# Patient Record
Sex: Female | Born: 1965 | Race: Asian | Hispanic: No | Marital: Married | Smoking: Never smoker
Health system: Southern US, Community
[De-identification: ages and names within clinical notes are randomized; demographics above are authoritative.]

## PROBLEM LIST (undated history)

## (undated) DIAGNOSIS — D219 Benign neoplasm of connective and other soft tissue, unspecified: Secondary | ICD-10-CM

## (undated) HISTORY — PX: APPENDECTOMY: SHX54

---

## 1991-08-26 HISTORY — PX: APPENDECTOMY: SHX54

## 2002-12-05 ENCOUNTER — Other Ambulatory Visit: Admission: RE | Admit: 2002-12-05 | Discharge: 2002-12-05 | Payer: Self-pay | Admitting: Internal Medicine

## 2002-12-05 ENCOUNTER — Other Ambulatory Visit: Admission: RE | Admit: 2002-12-05 | Discharge: 2002-12-05 | Payer: Self-pay | Admitting: Obstetrics and Gynecology

## 2005-06-26 ENCOUNTER — Ambulatory Visit: Payer: Self-pay | Admitting: Internal Medicine

## 2005-07-07 ENCOUNTER — Other Ambulatory Visit: Admission: RE | Admit: 2005-07-07 | Discharge: 2005-07-07 | Payer: Self-pay | Admitting: Obstetrics and Gynecology

## 2005-09-15 ENCOUNTER — Ambulatory Visit: Payer: Self-pay | Admitting: Internal Medicine

## 2006-01-22 ENCOUNTER — Inpatient Hospital Stay (HOSPITAL_COMMUNITY): Admission: AD | Admit: 2006-01-22 | Discharge: 2006-01-24 | Payer: Self-pay | Admitting: Obstetrics and Gynecology

## 2006-05-11 ENCOUNTER — Ambulatory Visit: Payer: Self-pay | Admitting: Internal Medicine

## 2007-01-24 LAB — CONVERTED CEMR LAB: Pap Smear: NORMAL

## 2007-05-07 DIAGNOSIS — M545 Low back pain: Secondary | ICD-10-CM

## 2007-10-21 ENCOUNTER — Ambulatory Visit: Payer: Self-pay | Admitting: Internal Medicine

## 2007-10-21 LAB — CONVERTED CEMR LAB
AST: 14 units/L (ref 0–37)
Alkaline Phosphatase: 42 units/L (ref 39–117)
CO2: 26 meq/L (ref 19–32)
Calcium: 9.1 mg/dL (ref 8.4–10.5)
Chloride: 106 meq/L (ref 96–112)
Cholesterol: 205 mg/dL (ref 0–200)
HCT: 37.4 % (ref 36.0–46.0)
HDL: 56.5 mg/dL (ref >39.0)
Hemoglobin: 12.1 g/dL (ref 12.0–15.0)
Lymphocytes Relative: 39 % (ref 12.0–46.0)
MCHC: 32.4 g/dL (ref 30.0–36.0)
MCV: 80 fL (ref 78.0–100.0)
Monocytes Absolute: 0.4 10*3/uL (ref 0.2–0.7)
Monocytes Relative: 4.5 % (ref 3.0–11.0)
Neutrophils Relative %: 52.8 % (ref 43.0–77.0)
Platelets: 318 10*3/uL (ref 150–400)
RBC: 4.68 M/uL (ref 3.87–5.11)
RDW: 13.9 % (ref 11.5–14.6)
Sodium: 138 meq/L (ref 135–145)
Total Bilirubin: 0.7 mg/dL (ref 0.3–1.2)
Triglycerides: 88 mg/dL (ref 0–149)
VLDL: 18 mg/dL (ref 0–40)
WBC: 8.2 10*3/uL (ref 4.5–10.5)

## 2007-10-28 ENCOUNTER — Ambulatory Visit: Payer: Self-pay | Admitting: Internal Medicine

## 2007-10-28 LAB — CONVERTED CEMR LAB
Nitrite: NEGATIVE
Protein, U semiquant: NEGATIVE
Urobilinogen, UA: 0.2
pH: 5.5

## 2008-06-14 ENCOUNTER — Encounter: Payer: Self-pay | Admitting: Internal Medicine

## 2008-06-15 ENCOUNTER — Encounter: Payer: Self-pay | Admitting: Internal Medicine

## 2008-06-15 ENCOUNTER — Encounter: Admission: RE | Admit: 2008-06-15 | Discharge: 2008-06-15 | Payer: Self-pay | Admitting: Gastroenterology

## 2008-06-21 ENCOUNTER — Encounter: Payer: Self-pay | Admitting: Internal Medicine

## 2008-07-12 ENCOUNTER — Encounter: Payer: Self-pay | Admitting: Internal Medicine

## 2009-02-19 ENCOUNTER — Ambulatory Visit: Payer: Self-pay | Admitting: Internal Medicine

## 2009-02-19 DIAGNOSIS — G44209 Tension-type headache, unspecified, not intractable: Secondary | ICD-10-CM

## 2010-10-24 LAB — HM MAMMOGRAPHY: HM Mammogram: NEGATIVE

## 2011-06-02 ENCOUNTER — Encounter (HOSPITAL_COMMUNITY): Payer: Self-pay | Admitting: *Deleted

## 2011-06-02 ENCOUNTER — Inpatient Hospital Stay (HOSPITAL_COMMUNITY)
Admission: AD | Admit: 2011-06-02 | Discharge: 2011-06-02 | Disposition: A | Discharge status: Home / Self Care | Payer: BC Managed Care – PPO | Source: Ambulatory Visit | Attending: Obstetrics and Gynecology | Admitting: Obstetrics and Gynecology

## 2011-06-02 DIAGNOSIS — N920 Excessive and frequent menstruation with regular cycle: Secondary | ICD-10-CM

## 2011-06-02 LAB — CBC
HCT: 33 % — ABNORMAL LOW (ref 36.0–46.0)
MCH: 26.8 pg (ref 26.0–34.0)
MCHC: 31.2 g/dL (ref 30.0–36.0)
MCV: 85.7 fL (ref 78.0–100.0)
Platelets: 228 10*3/uL (ref 150–400)

## 2011-06-02 LAB — POCT PREGNANCY, URINE: Preg Test, Ur: NEGATIVE

## 2011-06-02 MED ORDER — FERROUS SULFATE 325 (65 FE) MG PO TABS: 325.0000 mg | ORAL_TABLET | Freq: Every day | ORAL | Status: DC

## 2011-06-02 NOTE — Progress Notes (Signed)
EXAM-  DONE BY E. RICE, PA

## 2011-06-02 NOTE — ED Provider Notes (Signed)
History   Pt presents today c/o heavy vag bleeding since yesterday. She states she has been on a birth control patch to control her menses. She has not had menses in the past 4 months. She reports some lower abd cramping. She denies fever or any other sx at this time.  Chief Complaint  Patient presents with  . Vaginal Bleeding   HPI  OB History    Grav Para Term Preterm Abortions TAB SAB Ect Mult Living   3 3 3       3       No past medical history on file.  No past surgical history on file.  No family history on file.  History  Substance Use Topics  . Smoking status: Not on file  . Smokeless tobacco: Not on file  . Alcohol Use: Not on file    Allergies: No Known Allergies  Prescriptions prior to admission  Medication Sig Dispense Refill  . ibuprofen (ADVIL,MOTRIN) 200 MG tablet Take 200 mg by mouth every 6 (six) hours as needed. Patient takes for pain       . Multiple Vitamin (MULTIVITAMIN) tablet Take 1 tablet by mouth daily.          Review of Systems  Constitutional: Negative for fever and chills.  Cardiovascular: Negative for chest pain.  Gastrointestinal: Positive for abdominal pain. Negative for nausea, vomiting, diarrhea and constipation.  Genitourinary: Negative for dysuria, urgency, frequency and hematuria.  Neurological: Negative for dizziness and headaches.  Psychiatric/Behavioral: Negative for depression and suicidal ideas.   Physical Exam   Blood pressure 109/72, pulse 87, temperature 98.2 F (36.8 C), temperature source Oral, resp. rate 16, height 5' 1.5" (1.562 m), weight 127 lb (57.607 kg), last menstrual period 06/01/2011.  Physical Exam  Constitutional: She is oriented to person, place, and time. She appears well-developed and well-nourished. No distress.  HENT:  Head: Normocephalic and atraumatic.  Eyes: EOM are normal. Pupils are equal, round, and reactive to light.  GI: Soft. She exhibits no distension. There is no tenderness. There is no  rebound and no guarding.  Genitourinary: There is bleeding around the vagina. No vaginal discharge found.       Cervix Lg/closed. Moderate amount of bleeding noted on exam.  Neurological: She is alert and oriented to person, place, and time.  Skin: Skin is warm and dry. She is not diaphoretic.  Psychiatric: She has a normal mood and affect. Her behavior is normal. Judgment and thought content normal.    MAU Course  Procedures  Results for orders placed during the hospital encounter of 06/02/11 (from the past 24 hour(s))  CBC     Status: Abnormal   Collection Time   06/02/11  9:04 PM      Component Value Range   WBC 7.5  4.0 - 10.5 (K/uL)   RBC 3.85 (*) 3.87 - 5.11 (MIL/uL)   Hemoglobin 10.3 (*) 12.0 - 15.0 (g/dL)   HCT 16.1 (*) 09.6 - 46.0 (%)   MCV 85.7  78.0 - 100.0 (fL)   MCH 26.8  26.0 - 34.0 (pg)   MCHC 31.2  30.0 - 36.0 (g/dL)   RDW 04.5  40.9 - 81.1 (%)   Platelets 228  150 - 400 (K/uL)  POCT PREGNANCY, URINE     Status: Normal   Collection Time   06/02/11  9:17 PM      Component Value Range   Preg Test, Ur NEGATIVE     Discussed pt with Dr. Claiborne Billings at  length. Pt will f/u in office.  Assessment and Plan  Menorrhagia: discussed with pt at length. She will f/u with Dr. Delray Alt office in the morning. Will give Rx for iron supplementation. Discussed diet, activity, risks, and precautions.  Clinton Gallant. Rice III, DrHSc, MPAS, PA-C  06/02/2011, 8:56 PM   Henrietta Hoover, PA 06/02/11 2135

## 2011-06-02 NOTE — Progress Notes (Signed)
Pt having heavy vag bleeding since 10/7, reports cramping and dizziness.

## 2011-06-02 NOTE — Progress Notes (Signed)
PT  USING BC PATCH- NO CYCLE IN June- THRU SEPT.  STILL USING PATCH- BUT  YESTERDAY STARTED BLEEDING- HEAVY WITH CLOTS.  IN RM 2 - MOD AMT BLOOD ON PAD WITH QUARTER SIZE CLOT .  CRAMPING STARTED YESTERDAY AM.   TOOK ADVIL AT 2PM- LITTLE RELIEF.

## 2011-06-03 NOTE — ED Provider Notes (Signed)
Discussed pt with Madaline Guthrie, was informed that there was some bleeding but not heavy and not soaking pads under patient.  Pt was asymptomatic with Hb of 10.3  Advised that pt can follow up in office and return to ER is begins to soak a pad in an hour.

## 2012-05-11 ENCOUNTER — Ambulatory Visit (INDEPENDENT_AMBULATORY_CARE_PROVIDER_SITE_OTHER): Payer: BC Managed Care – PPO | Admitting: Internal Medicine

## 2012-05-11 ENCOUNTER — Encounter: Payer: Self-pay | Admitting: Internal Medicine

## 2012-05-11 VITALS — BP 90/60 | HR 86 | Temp 98.0°F | Resp 18 | Ht 62.75 in | Wt 124.0 lb

## 2012-05-11 DIAGNOSIS — Z0000 Encounter for general adult medical examination without abnormal findings: Secondary | ICD-10-CM

## 2012-05-11 DIAGNOSIS — K8020 Calculus of gallbladder without cholecystitis without obstruction: Secondary | ICD-10-CM

## 2012-05-11 NOTE — Progress Notes (Signed)
Subjective:    Patient ID: Kathryn Lucero, female    DOB: 04/22/1966, 46 y.o.   MRN: [id number]  HPI  46 year old patient who is seen today to establish with this practice she is followed annually by gynecology and has enjoyed excellent health. She has also been evaluated by GI([facility]) and does have a history of asymptomatic cholelithiasis. No concerns or complaints today Past medical history is remarkable for a remote appendectomy She is a gravida 3 para 3 abortus 0 Social history she is married 3 children ages 2118 and 612 grade education works as a Advertising account planner Family history mother died at approximately age 53 unclear etiology father is in good health as are 4 brothers and 2 sisters. Father does have a history of hypertension and prostate cancer  History reviewed. No pertinent past medical history.  History   Social History  . Marital Status: Married    Spouse Name: N/A    Number of Children: N/A  . Years of Education: N/A   Occupational History  . Not on file.   Social History Main Topics  . Smoking status: Never Smoker   . Smokeless tobacco: Never Used  . Alcohol Use: No  . Drug Use: No  . Sexually Active: Not on file   Other Topics Concern  . Not on file   Social History Narrative  . No narrative on file    Past Surgical History  Procedure Date  . Appendectomy     Family History  Problem Relation Age of Onset  . Cancer Father     prostate  . Hypertension Father     No Known Allergies  Current Outpatient Prescriptions on File Prior to Visit  Medication Sig Dispense Refill  . ferrous sulfate (FERROUSUL) 325 (65 FE) MG tablet Take 1 tablet (325 mg total) by mouth daily with breakfast.  30 tablet  11  . ibuprofen (ADVIL,MOTRIN) 200 MG tablet Take 200 mg by mouth every 6 (six) hours as needed. Patient takes for pain       . Multiple Vitamin (MULTIVITAMIN) tablet Take 1 tablet by mouth daily.        . norelgestromin-ethinyl estradiol (ORTHO EVRA) 150-20  MCG/24HR transdermal patch Place 1 patch onto the skin once a week.          BP 90/60  Pulse 86  Temp 98 F (36.7 C) (Oral)  Resp 18  Ht 5' 2.75" (1.594 m)  Wt 124 lb (56.246 kg)  BMI 22.14 kg/m2  SpO2 98%  LMP 05/11/2012       Review of Systems  Constitutional: Negative for fever, appetite change, fatigue and unexpected weight change.  HENT: Negative for hearing loss, ear pain, nosebleeds, congestion, sore throat, mouth sores, trouble swallowing, neck stiffness, dental problem, voice change, sinus pressure and tinnitus.   Eyes: Negative for photophobia, pain, redness and visual disturbance.  Respiratory: Negative for cough, chest tightness and shortness of breath.   Cardiovascular: Negative for chest pain, palpitations and leg swelling.  Gastrointestinal: Negative for nausea, vomiting, abdominal pain, diarrhea, constipation, blood in stool, abdominal distention and rectal pain.  Genitourinary: Negative for dysuria, urgency, frequency, hematuria, flank pain, vaginal bleeding, vaginal discharge, difficulty urinating, genital sores, vaginal pain, menstrual problem and pelvic pain.  Musculoskeletal: Negative for back pain and arthralgias.  Skin: Negative for rash.  Neurological: Negative for dizziness, syncope, speech difficulty, weakness, light-headedness, numbness and headaches.  Hematological: Negative for adenopathy. Does not bruise/bleed easily.  Psychiatric/Behavioral: Negative for suicidal ideas, behavioral problems, self-injury, dysphoric  mood and agitation. The patient is not nervous/anxious.        Objective:   Physical Exam  Constitutional: She is oriented to person, place, and time. She appears well-developed and well-nourished.  HENT:  Head: Normocephalic and atraumatic.  Right Ear: External ear normal.  Left Ear: External ear normal.  Mouth/Throat: Oropharynx is clear and moist.  Eyes: Conjunctivae normal and EOM are normal.  Neck: Normal range of motion. Neck  supple. No JVD present. No thyromegaly present.  Cardiovascular: Normal rate, regular rhythm, normal heart sounds and intact distal pulses.   No murmur heard. Pulmonary/Chest: Effort normal and breath sounds normal. She has no wheezes. She has no rales.  Abdominal: Soft. Bowel sounds are normal. She exhibits no distension and no mass. There is no tenderness. There is no rebound and no guarding.  Musculoskeletal: Normal range of motion. She exhibits no edema and no tenderness.  Neurological: She is alert and oriented to person, place, and time. She has normal reflexes. No cranial nerve deficit. She exhibits normal muscle tone. Coordination normal.  Skin: Skin is warm and dry. No rash noted.  Psychiatric: She has a normal mood and affect. Her behavior is normal.          Assessment & Plan:   Preventive health examination Unremarkable exam Asymptomatic cholelithiasis

## 2012-05-11 NOTE — Patient Instructions (Signed)
It is important that you exercise regularly, at least 20 minutes 3 to 4 times per week.  If you develop chest pain or shortness of breath seek  medical attention.  Call or return to clinic prn if these symptoms worsen or fail to improve as anticipated.  

## 2012-06-04 ENCOUNTER — Other Ambulatory Visit: Payer: Self-pay | Admitting: Obstetrics and Gynecology

## 2012-06-04 DIAGNOSIS — R928 Other abnormal and inconclusive findings on diagnostic imaging of breast: Secondary | ICD-10-CM

## 2012-06-10 ENCOUNTER — Ambulatory Visit
Admission: RE | Admit: 2012-06-10 | Discharge: 2012-06-10 | Disposition: A | Discharge status: Home / Self Care | Payer: BC Managed Care – PPO | Source: Ambulatory Visit | Attending: Obstetrics and Gynecology | Admitting: Obstetrics and Gynecology

## 2012-06-10 DIAGNOSIS — R928 Other abnormal and inconclusive findings on diagnostic imaging of breast: Secondary | ICD-10-CM

## 2012-10-28 ENCOUNTER — Encounter: Payer: Self-pay | Admitting: Internal Medicine

## 2012-10-28 ENCOUNTER — Ambulatory Visit (INDEPENDENT_AMBULATORY_CARE_PROVIDER_SITE_OTHER): Payer: BC Managed Care – PPO | Admitting: Internal Medicine

## 2012-10-28 VITALS — BP 100/70 | Temp 97.6°F | Wt 129.0 lb

## 2012-10-28 DIAGNOSIS — R739 Hyperglycemia, unspecified: Secondary | ICD-10-CM

## 2012-10-28 DIAGNOSIS — G4700 Insomnia, unspecified: Secondary | ICD-10-CM

## 2012-10-28 MED ORDER — TRAZODONE HCL 50 MG PO TABS: 25.0000 mg | ORAL_TABLET | Freq: Every evening | ORAL | Status: DC | PRN

## 2012-10-28 NOTE — Patient Instructions (Signed)
It is important that you exercise regularly, at least 20 minutes 3 to 4 times per week.  If you develop chest pain or shortness of breath seek  medical attention.  Insomnia Insomnia is frequent trouble falling and/or staying asleep. Insomnia can be a long term problem or a short term problem. Both are common. Insomnia can be a short term problem when the wakefulness is related to a certain stress or worry. Long term insomnia is often related to ongoing stress during waking hours and/or poor sleeping habits. Overtime, sleep deprivation itself can make the problem worse. Every little thing feels more severe because you are overtired and your ability to cope is decreased. CAUSES   Stress, anxiety, and depression.  Poor sleeping habits.  Distractions such as TV in the bedroom.  Naps close to bedtime.  Engaging in emotionally charged conversations before bed.  Technical reading before sleep.  Alcohol and other sedatives. They may make the problem worse. They can hurt normal sleep patterns and normal dream activity.  Stimulants such as caffeine for several hours prior to bedtime.  Pain syndromes and shortness of breath can cause insomnia.  Exercise late at night.  Changing time zones may cause sleeping problems (jet lag). It is sometimes helpful to have someone observe your sleeping patterns. They should look for periods of not breathing during the night (sleep apnea). They should also look to see how long those periods last. If you live alone or observers are uncertain, you can also be observed at a sleep clinic where your sleep patterns will be professionally monitored. Sleep apnea requires a checkup and treatment. Give your caregivers your medical history. Give your caregivers observations your family has made about your sleep.  SYMPTOMS   Not feeling rested in the morning.  Anxiety and restlessness at bedtime.  Difficulty falling and staying asleep. TREATMENT   Your caregiver may  prescribe treatment for an underlying medical disorders. Your caregiver can give advice or help if you are using alcohol or other drugs for self-medication. Treatment of underlying problems will usually eliminate insomnia problems.  Medications can be prescribed for short time use. They are generally not recommended for lengthy use.  Over-the-counter sleep medicines are not recommended for lengthy use. They can be habit forming.  You can promote easier sleeping by making lifestyle changes such as:  Using relaxation techniques that help with breathing and reduce muscle tension.  Exercising earlier in the day.  Changing your diet and the time of your last meal. No night time snacks.  Establish a regular time to go to bed.  Counseling can help with stressful problems and worry.  Soothing music and white noise may be helpful if there are background noises you cannot remove.  Stop tedious detailed work at least one hour before bedtime. HOME CARE INSTRUCTIONS   Keep a diary. Inform your caregiver about your progress. This includes any medication side effects. See your caregiver regularly. Take note of:  Times when you are asleep.  Times when you are awake during the night.  The quality of your sleep.  How you feel the next day. This information will help your caregiver care for you.  Get out of bed if you are still awake after 15 minutes. Read or do some quiet activity. Keep the lights down. Wait until you feel sleepy and go back to bed.  Keep regular sleeping and waking hours. Avoid naps.  Exercise regularly.  Avoid distractions at bedtime. Distractions include watching television or engaging in   any intense or detailed activity like attempting to balance the household checkbook.  Develop a bedtime ritual. Keep a familiar routine of bathing, brushing your teeth, climbing into bed at the same time each night, listening to soothing music. Routines increase the success of falling to  sleep faster.  Use relaxation techniques. This can be using breathing and muscle tension release routines. It can also include visualizing peaceful scenes. You can also help control troubling or intruding thoughts by keeping your mind occupied with boring or repetitive thoughts like the old concept of counting sheep. You can make it more creative like imagining planting one beautiful flower after another in your backyard garden.  During your day, work to eliminate stress. When this is not possible use some of the previous suggestions to help reduce the anxiety that accompanies stressful situations. MAKE SURE YOU:   Understand these instructions.  Will watch your condition.  Will get help right away if you are not doing well or get worse. Document Released: 08/08/2000 Document Revised: 11/03/2011 Document Reviewed: 09/08/2007 [facility] Patient Information 2013 [place], [place]. How to Increase Fiber in the Meal Plan for Diabetes Increasing fiber in the diet has many benefits including lowering blood cholesterol, helping to control blood glucose (sugar), preventing constipation, and aiding in weight management by helping you feel full longer. Start adding fiber to your diet slowly. A gradual substitution of high fiber foods for low fiber foods will allow the digestive tract to adjust. Most men under 39 years of age should aim to eat 38 g of fiber a day. Women should aim for 25 g. Over 31 years of age, most men need 30 g of fiber and most women need 21 g. Below are some suggestions for increasing fiber.  Try whole-wheat bread instead of white bread. Look for words high on the list of ingredients such as whole wheat, whole rye, or whole oats.  Try baked potato with skin instead of mashed potatoes.  Try a fresh apple with skin instead of applesauce.  Try a fresh orange instead of orange juice.  Try popcorn instead of potato chips.  Try bran cereal instead of corn flakes.  Try kidney, whole  pinto, or garbanzo beans instead of bread.  Try whole-grain crackers instead of saltine crackers.  Try whole-wheat pasta instead of regular varieties.  While on a high fiber diet, drink enough water and fluids to keep your urine clear or pale yellow.  Eat a variety of high fiber foods such as fruits, vegetables, whole grains, nuts, and seeds.  Try to increase your intake of fiber by eating high fiber foods instead of taking fiber pills or supplements that contain small amounts of fiber. There can be additional benefits for long-term health and blood glucose control with high fiber foods. Aim for 5 servings of fruit and vegetables per day. SOURCES OF FIBER The following list shows the average dietary fiber for types of food in the various food groups. Starch and Bread / Dietary Fiber (g)  Whole-grain breads, 1 slice / 2 g  Whole grain,  cup / 2 g  Whole-grain cereals,  cup / 3 g  Bran cereals,  to  cup / 8 g  Starchy vegetables,  cup / 3 g  Legumes (beans, peas, lentils),  cup / 8 g  Oatmeal,  cup / 2 g  Whole-wheat pasta,  cup / 2 g  Brown rice,  cup / 2 g  Barley,  cup / 3 g Meat and Meat Substitutes /  Dietary Fiber (g) This group averages 0 grams of fiber. Exceptions are:  Nuts, seeds, 1 oz or  cup / 3 g  Chunky peanut butter, 2 tbs / 3 g Vegetables / Dietary Fiber (g)  Cooked vegetables,  to  cup / 2 to 3 g  Raw vegetables, 1 to 2 cups / 2 to 3 g Fruit / Dietary Fiber (g)  Raw or cooked fruit,  cup or 1 small, fresh piece / 2 g Milk / Dietary Fiber (g)  Milk, 1 cup or 8 oz / 0 g Fats and Oils / Dietary Fiber (g)  Fats and oils, 1 tsp / 0 g You can determine how much fiber you are eating by reading the Nutrition Facts panel on the labels of the foods you eat. FIBER IN SPECIFIC FOODS Cereals / Dietary Fiber (g)  All Bran,  cup / 9 g  All Bran with Extra Fiber,  cup / 13 g  Bran Flakes,  cup / 4 g  Cheerios,  cup / 1.5 g  Corn  Bran,  cup / 4 g  Corn Flakes,  cup / 0.75 g  Cracklin' Oat Bran,  cup / 4 g  Fiber One,  cup / 13 g  Grape Nuts, 3 tbs / 3 g  Grape Nuts Flakes,  cup / 3 g  Noodles,  cup, cooked / 0.5 g  Nutrigrain Wheat,  cup / 3.5 g  Oatmeal,  cup, cooked / 1.1 g  Pasta, white (macaroni, spaghetti),  cup, cooked / 0.5 g  Pasta, whole-wheat (macaroni, spaghetti),  cup, cooked / 2 g  [place],  cup, cooked / 3 g  Rice, wild,  cup, cooked / 0.5 g  Rice, brown,  cup, cooked / 1 g  Rice, white,  cup, cooked / 0.2 g  Shredded Wheat, bite-sized,  cup / 2 g  Total,  cup / 1.75 g  Wheat Chex,  cup / 2.5 g  Wheatena,  cup, cooked / 4 g  Wheaties,  cup / 2.75 g Bread, Starchy Vegetables, and Dried Peas and Beans / Dietary Fiber (g)  Bagel, whole / 0.6 g  Baked beans in tomato sauce,  cup, cooked / 3 g  Bran muffin, 1 small / 2.5 g  Bread, cracked wheat, 1 slice / 2.5 g  Bread, pumpernickel, 1 slice / 2.5 g  Bread, white, 1 slice / 0.4 g  Bread, whole-wheat, 1 slice / 1.4 g  Corn,  cup, canned / 2.9 g  Kidney beans,  cup, cooked / 3.5 g  Lentils, cup, cooked / 3 g  Lima beans,  cup, cooked / 4 g  Navy beans,  cup, cooked / 4 g  Peas,  cup, cooked / 4 g  Popcorn, 3 cups popped, unbuttered / 3.5 g  Potato, baked (with skin), 1 small / 4 g  Potato, baked (without skin), 1 small / 2 g  Ry-Krisp, 4 crackers / 3 g  Saltine crackers, 6 squares / 0 g  Split peas,  cup, cooked / 2.5 g  Yams (sweet potato),  cup / 1.7 g Fruit / Dietary Fiber (g)  Apple, 1 small, fresh, with skin / 4 g  Apple juice,  cup / 0.4 g  Apricots, 4 medium, fresh / 4 g  Apricots, 7 halves, dried / 2 g  Banana,  medium / 1.2 g  Blueberries,  cup / 2 g  Cantaloupe, melon / 1.3 g  Cherries,  cup, canned / 1.4 g  Grapefruit,  medium / 1.6 g  Grapes, 15 small / 1.2 g  Grape juice,  cup / 0.5 g  Orange, 1 medium, fresh / 2 g  Orange  juice,  cup / 0.5 g  Peach, 1 medium,fresh, with skin / 2 g  Pear, 1 medium, fresh, with skin / 4 g  Pineapple, cup, canned / 0.7 g  Plums, 2 whole / 2 g  Prunes, 3 whole / 1.5 g  Raspberries, 1 cup / 6 g  Strawberries, 1  cup / 4 g  Watermelon, 1  cup / 0.5 g Vegetables / Dietary Fiber (g)  Asparagus,  cup, cooked / 1 g  Beans, green and wax,  cup, cooked / 1.6 g  Beets,  cup, cooked / 1.8 g  Broccoli,  cup, cooked / 2.2 g  Brussels sprouts,  cup, cooked / 4 g  Cabbage,  cup, cooked / 2.5 g  Carrots,  cup, cooked / 2.3 g  Cauliflower,  cup, cooked / 1.1 g  Celery, 1 cup, raw / 1.5 g  Cucumber, 1 cup, raw / 0.8 g  Green pepper,  cup sliced, cooked / 1.5 g  Lettuce, 1 cup, sliced / 0.9 g  Mushrooms, 1 cup sliced, raw / 1.8 g  Onion, 1 cup sliced, raw / 1.6 g  Spinach,  cup, cooked / 2.4 g  Tomato, 1 medium, fresh / 1.5 g  Tomato juice,  cup / 0 g  Zucchini,  cup, cooked / 1.8 g Document Released: 01/31/2002 Document Revised: 11/03/2011 Document Reviewed: 02/27/2009 [facility] Patient Information 2013 [place], [place].

## 2012-10-28 NOTE — Progress Notes (Signed)
  Subjective:    Patient ID: Kathryn Lucero, female    DOB: 08-15-1966, 47 y.o.   MRN: [id number]  HPI 47 year old patient had laboratory studies performed with the OB/GYN. This revealed a fasting blood sugar is 77 and a hemoglobin A1c slightly elevated at 6.1. No family history of diabetes. Her weight and diet are excellent. Her chief complaint is insomnia 2 months duration. She often lays down with her six-year-old son fall asleep before her usual bedtime. She also some much better when she was exercising regularly. No caffeine load  History reviewed. No pertinent past medical history.  History   Social History  . Marital Status: Married    Spouse Name: N/A    Number of Children: N/A  . Years of Education: N/A   Occupational History  . Not on file.   Social History Main Topics  . Smoking status: Never Smoker   . Smokeless tobacco: Never Used  . Alcohol Use: No  . Drug Use: No  . Sexually Active: Not on file   Other Topics Concern  . Not on file   Social History Narrative  . No narrative on file    Past Surgical History  Procedure Laterality Date  . Appendectomy      Family History  Problem Relation Age of Onset  . Cancer Father     prostate  . Hypertension Father     No Known Allergies  Current Outpatient Prescriptions on File Prior to Visit  Medication Sig Dispense Refill  . ibuprofen (ADVIL,MOTRIN) 200 MG tablet Take 200 mg by mouth every 6 (six) hours as needed. Patient takes for pain       . norelgestromin-ethinyl estradiol (ORTHO EVRA) 150-20 MCG/24HR transdermal patch Place 1 patch onto the skin once a week.        . ferrous sulfate (FERROUSUL) 325 (65 FE) MG tablet Take 1 tablet (325 mg total) by mouth daily with breakfast.  30 tablet  11   No current facility-administered medications on file prior to visit.    BP 100/70  Temp(Src) 97.6 F (36.4 C) (Oral)  Wt 129 lb (58.514 kg)  BMI 23.03 kg/m2         Review of Systems  Constitutional:  Negative.   HENT: Negative for hearing loss, congestion, sore throat, rhinorrhea, dental problem, sinus pressure and tinnitus.   Eyes: Negative for pain, discharge and visual disturbance.  Respiratory: Negative for cough and shortness of breath.   Cardiovascular: Negative for chest pain, palpitations and leg swelling.  Gastrointestinal: Negative for nausea, vomiting, abdominal pain, diarrhea, constipation, blood in stool and abdominal distention.  Genitourinary: Negative for dysuria, urgency, frequency, hematuria, flank pain, vaginal bleeding, vaginal discharge, difficulty urinating, vaginal pain and pelvic pain.  Musculoskeletal: Negative for joint swelling, arthralgias and gait problem.  Skin: Negative for rash.  Neurological: Negative for dizziness, syncope, speech difficulty, weakness, numbness and headaches.  Hematological: Negative for adenopathy.  Psychiatric/Behavioral: Positive for sleep disturbance. Negative for behavioral problems, dysphoric mood and agitation. The patient is not nervous/anxious.        Objective:   Physical Exam  Constitutional: She appears well-developed and well-nourished. No distress.  Weight 129  Psychiatric: She has a normal mood and affect. Her behavior is normal. Thought content normal.          Assessment & Plan     Possible impaired glucose tolerance. We'll check a hemoglobin A1c in 6 months. We'll resume her exercise regimen Insomnia. Sleep hygiene issues discussed

## 2013-05-31 ENCOUNTER — Encounter: Payer: Self-pay | Admitting: Internal Medicine

## 2013-05-31 ENCOUNTER — Ambulatory Visit (INDEPENDENT_AMBULATORY_CARE_PROVIDER_SITE_OTHER): Payer: BC Managed Care – PPO | Admitting: Internal Medicine

## 2013-05-31 VITALS — BP 100/60 | HR 78 | Temp 97.9°F | Resp 18 | Wt 126.0 lb

## 2013-05-31 DIAGNOSIS — R7309 Other abnormal glucose: Secondary | ICD-10-CM

## 2013-05-31 DIAGNOSIS — R739 Hyperglycemia, unspecified: Secondary | ICD-10-CM

## 2013-05-31 DIAGNOSIS — Z23 Encounter for immunization: Secondary | ICD-10-CM

## 2013-05-31 NOTE — Progress Notes (Signed)
Subjective:    Patient ID: Kathryn Lucero, female    DOB: 1966/02/21, 47 y.o.   MRN: [id number]  HPI  47 year old patient who is seen today in followup. She was seen by OB/GYN earlier in the year and had a blood sugar was mildly elevated.  Her hemoglobin A1c was 6.1. She feels well today but still slightly concerned about her glycemic control. She's had a recent follow GYN exam Asymptomatic today. She does have a history of cholelithiasis and low back pain and headaches. Her insomnia has resolved.  History reviewed. No pertinent past medical history.  History   Social History  . Marital Status: Married    Spouse Name: N/A    Number of Children: N/A  . Years of Education: N/A   Occupational History  . Not on file.   Social History Main Topics  . Smoking status: Never Smoker   . Smokeless tobacco: Never Used  . Alcohol Use: No  . Drug Use: No  . Sexual Activity: Not on file   Other Topics Concern  . Not on file   Social History Narrative  . No narrative on file    Past Surgical History  Procedure Laterality Date  . Appendectomy      Family History  Problem Relation Age of Onset  . Cancer Father     prostate  . Hypertension Father     No Known Allergies  Current Outpatient Prescriptions on File Prior to Visit  Medication Sig Dispense Refill  . ibuprofen (ADVIL,MOTRIN) 200 MG tablet Take 200 mg by mouth every 6 (six) hours as needed. Patient takes for pain       . norelgestromin-ethinyl estradiol (ORTHO EVRA) 150-20 MCG/24HR transdermal patch Place 1 patch onto the skin once a week.         No current facility-administered medications on file prior to visit.    BP 100/60  Pulse 78  Temp(Src) 97.9 F (36.6 C) (Oral)  Resp 18  Wt 126 lb (57.153 kg)  BMI 22.49 kg/m2  SpO2 98%  LMP 05/29/2013       Review of Systems  Constitutional: Negative.   HENT: Negative for hearing loss, congestion, sore throat, rhinorrhea, dental problem, sinus pressure and  tinnitus.   Eyes: Negative for pain, discharge and visual disturbance.  Respiratory: Negative for cough and shortness of breath.   Cardiovascular: Negative for chest pain, palpitations and leg swelling.  Gastrointestinal: Negative for nausea, vomiting, abdominal pain, diarrhea, constipation, blood in stool and abdominal distention.  Genitourinary: Negative for dysuria, urgency, frequency, hematuria, flank pain, vaginal bleeding, vaginal discharge, difficulty urinating, vaginal pain and pelvic pain.  Musculoskeletal: Negative for joint swelling, arthralgias and gait problem.  Skin: Negative for rash.  Neurological: Negative for dizziness, syncope, speech difficulty, weakness, numbness and headaches.  Hematological: Negative for adenopathy.  Psychiatric/Behavioral: Negative for behavioral problems, dysphoric mood and agitation. The patient is not nervous/anxious.        Objective:   Physical Exam  Constitutional: She is oriented to person, place, and time. She appears well-developed and well-nourished.  HENT:  Head: Normocephalic.  Right Ear: External ear normal.  Left Ear: External ear normal.  Mouth/Throat: Oropharynx is clear and moist.  Eyes: Conjunctivae and EOM are normal. Pupils are equal, round, and reactive to light.  Neck: Normal range of motion. Neck supple. No thyromegaly present.  Cardiovascular: Normal rate, regular rhythm, normal heart sounds and intact distal pulses.   Pulmonary/Chest: Effort normal and breath sounds normal.  Abdominal: Soft. Bowel  sounds are normal. She exhibits no mass. There is no tenderness.  Musculoskeletal: Normal range of motion.  Lymphadenopathy:    She has no cervical adenopathy.  Neurological: She is alert and oriented to person, place, and time.  Skin: Skin is warm and dry. No rash noted.  Psychiatric: She has a normal mood and affect. Her behavior is normal.          Assessment & Plan:   Preventive health exam History of mild  glucose intolerance. We'll recheck a random blood sugar  Return here when necessary

## 2013-05-31 NOTE — Patient Instructions (Signed)
It is important that you exercise regularly, at least 20 minutes 3 to 4 times per week.  If you develop chest pain or shortness of breath seek  medical attention.   

## 2014-06-26 ENCOUNTER — Encounter: Payer: Self-pay | Admitting: Internal Medicine

## 2014-06-26 LAB — HM MAMMOGRAPHY: HM Mammogram: NORMAL (ref 0–4)

## 2017-01-22 ENCOUNTER — Encounter: Payer: Self-pay | Admitting: Family Medicine

## 2017-02-26 ENCOUNTER — Other Ambulatory Visit: Payer: Self-pay | Admitting: Obstetrics and Gynecology

## 2017-02-26 DIAGNOSIS — D250 Submucous leiomyoma of uterus: Secondary | ICD-10-CM

## 2017-03-12 ENCOUNTER — Ambulatory Visit
Admission: RE | Admit: 2017-03-12 | Discharge: 2017-03-12 | Disposition: A | Discharge status: Home / Self Care | Payer: BLUE CROSS/BLUE SHIELD | Source: Ambulatory Visit | Attending: Obstetrics and Gynecology | Admitting: Obstetrics and Gynecology

## 2017-03-12 ENCOUNTER — Other Ambulatory Visit (HOSPITAL_COMMUNITY): Payer: Self-pay | Admitting: Interventional Radiology

## 2017-03-12 DIAGNOSIS — D250 Submucous leiomyoma of uterus: Secondary | ICD-10-CM

## 2017-03-12 HISTORY — PX: IR RADIOLOGIST EVAL & MGMT: IMG5224

## 2017-03-12 HISTORY — DX: Benign neoplasm of connective and other soft tissue, unspecified: D21.9

## 2017-03-12 NOTE — Consult Note (Signed)
Chief Complaint:  Abnormal menstrual bleeding, uterine fibroids.  Referring Physician(s): Horvath,Michelle  History of Present Illness: Kathryn Lucero is a 51 y.o. female with abnormal menstrual bleeding related to uterine fibroids. She is a G3 P3. She has no future pregnancy plans. She is currently on a Xulane patch for birth control. Review of her menstrual cycle was performed. Last menstrual period 03/05/2017. Frequency every month lasting 7 days. She has 4 heavy days with passage of fresh blood clots. Free to see of pad changes every hour. She also reports some new interperiod bleeding. Fibroids were recently diagnosed my office ultrasound measuring up to 6 cm. She has mild bulk related symptoms with bloating, urinary frequency, abdominal pain and pressure. She has had no prior treatments for fibroids including birth-control pills or hormone replacement last Pap smear 05/25/2017 was within normal limits.  Past Medical History:  Diagnosis Date  . Fibroids     Past Surgical History:  Procedure Laterality Date  . APPENDECTOMY      Allergies: No known allergies  Medications: Prior to Admission medications   Medication Sig Start Date End Date Taking? Authorizing Provider  ibuprofen (ADVIL,MOTRIN) 200 MG tablet Take 200 mg by mouth every 6 (six) hours as needed. Patient takes for pain    Yes [provider]  norelgestromin-ethinyl estradiol (ORTHO EVRA) 150-20 MCG/24HR transdermal patch Place 1 patch onto the skin once a week.     Yes [provider]     Family History  Problem Relation Age of Onset  . Cancer Father        prostate  . Hypertension Father     Social History   Social History  . Marital status: Married    Spouse name: N/A  . Number of children: N/A  . Years of education: N/A   Social History Main Topics  . Smoking status: Never Smoker  . Smokeless tobacco: Never Used  . Alcohol use No  . Drug use: No  . Sexual activity: Not on  file   Other Topics Concern  . Not on file   Social History Narrative  . No narrative on file     Review of Systems: A 12 point ROS discussed and pertinent positives are indicated in the HPI above.  All other systems are negative.  Review of Systems  Vital Signs: BP 103/69   Pulse 67   Temp 97.9 F (36.6 C) (Oral)   Resp 14   Ht 5' 2.5" (1.588 m)   Wt 128 lb (58.1 kg)   LMP 03/05/2017   SpO2 100%   BMI 23.04 kg/m   Physical Exam  Constitutional: She is oriented to person, place, and time. She appears well-developed. No distress.  Eyes: Conjunctivae are normal.  Cardiovascular: Normal rate and regular rhythm.   No murmur heard. Pulmonary/Chest: Effort normal and breath sounds normal. No respiratory distress.  Abdominal: Soft. Bowel sounds are normal. She exhibits no distension.  Musculoskeletal: She exhibits no edema or tenderness.  Neurological: She is alert and oriented to person, place, and time.  Skin: Skin is warm and dry. She is not diaphoretic. No erythema.  Psychiatric: She has a normal mood and affect. Her behavior is normal.     Imaging: No results found.  Labs:  CBC: No results for input(s): WBC, HGB, HCT, PLT in the last 8760 hours.  COAGS: No results for input(s): INR, APTT in the last 8760 hours.  BMP: No results for input(s): NA, K,  CL, CO2, GLUCOSE, BUN, CALCIUM, CREATININE, GFRNONAA, GFRAA in the last 8760 hours.  Invalid input(s): CMP  LIVER FUNCTION TESTS: No results for input(s): BILITOT, AST, ALT, ALKPHOS, PROT, ALBUMIN in the last 8760 hours.    Assessment and Plan:  Progressive abnormal menstrual bleeding secondary to symptomatically uterine fibroids. Treatment options were reviewed. Our discussion centered around uterine fibroid embolization. The procedure, risks, benefits and alternatives were reviewed. The expected goals, recovery and outcomes were also reviewed. All questions were addressed. She was accompanied by her sister  today. After our discussion she would like to proceed with the workup. MRI of the pelvis has been scheduled 03/24/2017 at [facility] to evaluate her fibroid anatomy and make sure she is a candidate for embolization. If she is a candidate, she would need to come off the contraception for one month. She is in agreement with this.  Thank you for this interesting consult.  I greatly enjoyed meeting [name] and look forward to participating in their care.  A copy of this report was sent to the requesting provider on this date.  Electronically Signed: Greggory Keen 03/12/2017, 9:51 AM   I spent a total of  30 Minutes   in face to face in clinical consultation, greater than 50% of which was counseling/coordinating care for abnormal menstrual bleeding from uterine fibroids

## 2017-03-24 ENCOUNTER — Ambulatory Visit (HOSPITAL_COMMUNITY)
Admission: RE | Admit: 2017-03-24 | Discharge: 2017-03-24 | Disposition: A | Discharge status: Home / Self Care | Payer: BLUE CROSS/BLUE SHIELD | Source: Ambulatory Visit | Attending: Interventional Radiology | Admitting: Interventional Radiology

## 2017-03-24 DIAGNOSIS — N800 Endometriosis of uterus: Secondary | ICD-10-CM | POA: Diagnosis not present

## 2017-03-24 DIAGNOSIS — D250 Submucous leiomyoma of uterus: Secondary | ICD-10-CM | POA: Insufficient documentation

## 2017-03-24 DIAGNOSIS — D252 Subserosal leiomyoma of uterus: Secondary | ICD-10-CM | POA: Diagnosis not present

## 2017-03-24 MED ORDER — GADOBENATE DIMEGLUMINE 529 MG/ML IV SOLN
10.0000 mL | Freq: Once | INTRAVENOUS | Status: AC | PRN
  Administered 2017-03-24: 10 mL via INTRAVENOUS

## 2017-04-13 ENCOUNTER — Other Ambulatory Visit (HOSPITAL_COMMUNITY): Payer: Self-pay | Admitting: Interventional Radiology

## 2017-04-13 ENCOUNTER — Other Ambulatory Visit: Payer: Self-pay | Admitting: Obstetrics and Gynecology

## 2017-04-13 DIAGNOSIS — D250 Submucous leiomyoma of uterus: Secondary | ICD-10-CM

## 2017-05-05 ENCOUNTER — Other Ambulatory Visit: Payer: Self-pay | Admitting: Student

## 2017-05-06 ENCOUNTER — Observation Stay (HOSPITAL_COMMUNITY)
Admission: RE | Admit: 2017-05-06 | Discharge: 2017-05-07 | Disposition: A | Discharge status: Home / Self Care | Payer: BLUE CROSS/BLUE SHIELD | Source: Ambulatory Visit | Attending: Interventional Radiology | Admitting: Interventional Radiology

## 2017-05-06 ENCOUNTER — Ambulatory Visit (HOSPITAL_COMMUNITY)
Admission: RE | Admit: 2017-05-06 | Discharge: 2017-05-06 | Disposition: A | Discharge status: Home / Self Care | Payer: BLUE CROSS/BLUE SHIELD | Source: Ambulatory Visit | Attending: Interventional Radiology | Admitting: Interventional Radiology

## 2017-05-06 ENCOUNTER — Encounter (HOSPITAL_COMMUNITY): Payer: Self-pay

## 2017-05-06 DIAGNOSIS — D250 Submucous leiomyoma of uterus: Secondary | ICD-10-CM | POA: Diagnosis present

## 2017-05-06 DIAGNOSIS — N800 Endometriosis of uterus: Principal | ICD-10-CM | POA: Insufficient documentation

## 2017-05-06 DIAGNOSIS — N926 Irregular menstruation, unspecified: Secondary | ICD-10-CM | POA: Insufficient documentation

## 2017-05-06 HISTORY — PX: IR EMBO TUMOR ORGAN ISCHEMIA INFARCT INC GUIDE ROADMAPPING: IMG5449

## 2017-05-06 HISTORY — PX: IR ANGIOGRAM PELVIS SELECTIVE OR SUPRASELECTIVE: IMG661

## 2017-05-06 HISTORY — PX: IR US GUIDE VASC ACCESS LEFT: IMG2389

## 2017-05-06 HISTORY — PX: IR US GUIDE VASC ACCESS RIGHT: IMG2390

## 2017-05-06 HISTORY — PX: IR ANGIOGRAM SELECTIVE EACH ADDITIONAL VESSEL: IMG667

## 2017-05-06 LAB — CBC WITH DIFFERENTIAL/PLATELET
BASOS ABS: 0.1 10*3/uL (ref 0.0–0.1)
BASOS PCT: 1 %
EOS ABS: 0.2 10*3/uL (ref 0.0–0.7)
Eosinophils Relative: 3 %
HCT: 32.1 % — ABNORMAL LOW (ref 36.0–46.0)
HEMOGLOBIN: 9.2 g/dL — AB (ref 12.0–15.0)
LYMPHS PCT: 37 %
Lymphs Abs: 2.6 10*3/uL (ref 0.7–4.0)
MCH: 20.2 pg — ABNORMAL LOW (ref 26.0–34.0)
MCHC: 28.7 g/dL — ABNORMAL LOW (ref 30.0–36.0)
MCV: 70.5 fL — ABNORMAL LOW (ref 78.0–100.0)
MONO ABS: 0.4 10*3/uL (ref 0.1–1.0)
Monocytes Relative: 6 %
NEUTROS PCT: 53 %
Neutro Abs: 3.6 10*3/uL (ref 1.7–7.7)
PLATELETS: 312 10*3/uL (ref 150–400)
RBC: 4.55 MIL/uL (ref 3.87–5.11)
RDW: 19.6 % — ABNORMAL HIGH (ref 11.5–15.5)
WBC: 6.9 10*3/uL (ref 4.0–10.5)

## 2017-05-06 LAB — BASIC METABOLIC PANEL
Anion gap: 9 (ref 5–15)
BUN: 12 mg/dL (ref 6–20)
CO2: 23 mmol/L (ref 22–32)
Calcium: 8.6 mg/dL — ABNORMAL LOW (ref 8.9–10.3)
Chloride: 107 mmol/L (ref 101–111)
Creatinine, Ser: 0.62 mg/dL (ref 0.44–1.00)
GFR calc Af Amer: >60 mL/min (ref >60)
GFR calc non Af Amer: >60 mL/min (ref >60)
Glucose, Bld: 100 mg/dL — ABNORMAL HIGH (ref 65–99)
Potassium: 4 mmol/L (ref 3.5–5.1)
Sodium: 139 mmol/L (ref 135–145)

## 2017-05-06 LAB — APTT: aPTT: 23 seconds — ABNORMAL LOW (ref 24–36)

## 2017-05-06 LAB — HCG, SERUM, QUALITATIVE: Preg, Serum: NEGATIVE

## 2017-05-06 LAB — PROTIME-INR
INR: 0.99
PROTHROMBIN TIME: 13 s (ref 11.4–15.2)

## 2017-05-06 MED ORDER — DIPHENHYDRAMINE HCL 50 MG/ML IJ SOLN: 12.5000 mg | Freq: Four times a day (QID) | INTRAMUSCULAR | Status: DC | PRN

## 2017-05-06 MED ORDER — SODIUM CHLORIDE 0.9% FLUSH: 3.0000 mL | INTRAVENOUS | Status: DC | PRN

## 2017-05-06 MED ORDER — SODIUM CHLORIDE 0.9% FLUSH: 9.0000 mL | INTRAVENOUS | Status: DC | PRN

## 2017-05-06 MED ORDER — KETOROLAC TROMETHAMINE 30 MG/ML IJ SOLN
30.0000 mg | Freq: Once | INTRAMUSCULAR | Status: AC
  Administered 2017-05-06: 30 mg via INTRAVENOUS
  Filled 2017-05-06: qty 1

## 2017-05-06 MED ORDER — PROMETHAZINE HCL 25 MG PO TABS
25.0000 mg | ORAL_TABLET | Freq: Three times a day (TID) | ORAL | Status: DC | PRN
  Administered 2017-05-07: 25 mg via ORAL
  Filled 2017-05-06: qty 1

## 2017-05-06 MED ORDER — CEFAZOLIN SODIUM-DEXTROSE 2-4 GM/100ML-% IV SOLN
INTRAVENOUS | Status: AC
  Administered 2017-05-06: 2 g via INTRAVENOUS
  Filled 2017-05-06: qty 100

## 2017-05-06 MED ORDER — SODIUM CHLORIDE 0.9 % IV SOLN
INTRAVENOUS | Status: DC
  Administered 2017-05-06: 08:00:00 via INTRAVENOUS

## 2017-05-06 MED ORDER — DOCUSATE SODIUM 100 MG PO CAPS
100.0000 mg | ORAL_CAPSULE | Freq: Two times a day (BID) | ORAL | Status: DC
  Administered 2017-05-06 – 2017-05-07 (×2): 100 mg via ORAL
  Filled 2017-05-06 (×3): qty 1

## 2017-05-06 MED ORDER — SODIUM CHLORIDE 0.9 % IV SOLN: 250.0000 mL | INTRAVENOUS | Status: DC | PRN

## 2017-05-06 MED ORDER — IOPAMIDOL (ISOVUE-300) INJECTION 61%
50.0000 mL | Freq: Once | INTRAVENOUS | Status: AC | PRN
  Administered 2017-05-06: 30 mL via INTRAVENOUS

## 2017-05-06 MED ORDER — MIDAZOLAM HCL 2 MG/2ML IJ SOLN
INTRAMUSCULAR | Status: AC
  Filled 2017-05-06: qty 6

## 2017-05-06 MED ORDER — IOPAMIDOL (ISOVUE-300) INJECTION 61%
INTRAVENOUS | Status: AC
  Filled 2017-05-06: qty 100

## 2017-05-06 MED ORDER — MIDAZOLAM HCL 2 MG/2ML IJ SOLN
INTRAMUSCULAR | Status: AC | PRN
  Administered 2017-05-06 (×2): 1 mg via INTRAVENOUS

## 2017-05-06 MED ORDER — NALOXONE HCL 0.4 MG/ML IJ SOLN: 0.4000 mg | INTRAMUSCULAR | Status: DC | PRN

## 2017-05-06 MED ORDER — CEFAZOLIN SODIUM-DEXTROSE 2-4 GM/100ML-% IV SOLN
2.0000 g | Freq: Once | INTRAVENOUS | Status: AC
  Administered 2017-05-06: 2 g via INTRAVENOUS

## 2017-05-06 MED ORDER — ONDANSETRON HCL 4 MG/2ML IJ SOLN
4.0000 mg | Freq: Four times a day (QID) | INTRAMUSCULAR | Status: DC | PRN
  Administered 2017-05-06: 4 mg via INTRAVENOUS
  Filled 2017-05-06: qty 2

## 2017-05-06 MED ORDER — IOPAMIDOL (ISOVUE-300) INJECTION 61%
INTRAVENOUS | Status: AC
  Administered 2017-05-06: 30 mL via INTRAVENOUS
  Filled 2017-05-06: qty 50

## 2017-05-06 MED ORDER — IOPAMIDOL (ISOVUE-300) INJECTION 61%
INTRAVENOUS | Status: AC
  Administered 2017-05-06: 60 mL via INTRAVENOUS
  Filled 2017-05-06: qty 100

## 2017-05-06 MED ORDER — DEXAMETHASONE 4 MG PO TABS
8.0000 mg | ORAL_TABLET | ORAL | Status: AC
  Administered 2017-05-06: 8 mg via ORAL
  Filled 2017-05-06: qty 2

## 2017-05-06 MED ORDER — LIDOCAINE HCL 1 % IJ SOLN
INTRAMUSCULAR | Status: AC | PRN
  Administered 2017-05-06: 10 mL

## 2017-05-06 MED ORDER — FENTANYL CITRATE (PF) 100 MCG/2ML IJ SOLN
INTRAMUSCULAR | Status: AC
  Filled 2017-05-06: qty 4

## 2017-05-06 MED ORDER — PROMETHAZINE HCL 25 MG RE SUPP: 25.0000 mg | Freq: Three times a day (TID) | RECTAL | Status: DC | PRN

## 2017-05-06 MED ORDER — KETOROLAC TROMETHAMINE 30 MG/ML IJ SOLN
30.0000 mg | Freq: Four times a day (QID) | INTRAMUSCULAR | Status: DC
  Administered 2017-05-06 – 2017-05-07 (×5): 30 mg via INTRAVENOUS
  Filled 2017-05-06 (×5): qty 1

## 2017-05-06 MED ORDER — FENTANYL CITRATE (PF) 100 MCG/2ML IJ SOLN
INTRAMUSCULAR | Status: AC | PRN
  Administered 2017-05-06 (×3): 50 ug via INTRAVENOUS

## 2017-05-06 MED ORDER — DIPHENHYDRAMINE HCL 12.5 MG/5ML PO ELIX
12.5000 mg | ORAL_SOLUTION | Freq: Four times a day (QID) | ORAL | Status: DC | PRN
  Filled 2017-05-06: qty 5

## 2017-05-06 MED ORDER — IOPAMIDOL (ISOVUE-300) INJECTION 61%
100.0000 mL | Freq: Once | INTRAVENOUS | Status: AC | PRN
  Administered 2017-05-06: 60 mL via INTRAVENOUS

## 2017-05-06 MED ORDER — IOPAMIDOL (ISOVUE-300) INJECTION 61%: 100.0000 mL | Freq: Once | INTRAVENOUS | Status: DC | PRN

## 2017-05-06 MED ORDER — LIDOCAINE HCL (PF) 2 % IJ SOLN
INTRAMUSCULAR | Status: AC
  Filled 2017-05-06: qty 10

## 2017-05-06 MED ORDER — HYDROMORPHONE 1 MG/ML IV SOLN
INTRAVENOUS | Status: DC
  Administered 2017-05-06: 11:00:00 via INTRAVENOUS
  Administered 2017-05-06: 4.5 mg via INTRAVENOUS
  Administered 2017-05-06 – 2017-05-07 (×2): 1.2 mg via INTRAVENOUS
  Administered 2017-05-07: 0 mg via INTRAVENOUS
  Administered 2017-05-07: 1.5 mg via INTRAVENOUS
  Filled 2017-05-06: qty 25

## 2017-05-06 MED ORDER — ONDANSETRON HCL 4 MG/2ML IJ SOLN
4.0000 mg | Freq: Four times a day (QID) | INTRAMUSCULAR | Status: DC | PRN
  Administered 2017-05-06: 4 mg via INTRAVENOUS
  Filled 2017-05-06 (×2): qty 2

## 2017-05-06 MED ORDER — SODIUM CHLORIDE 0.9% FLUSH: 3.0000 mL | Freq: Two times a day (BID) | INTRAVENOUS | Status: DC

## 2017-05-06 NOTE — Progress Notes (Signed)
R and L groin dressings intact. No bleeding,no hematoma. 2+ pedal pulses, and 2+ bil. Posterior tibial pulses.

## 2017-05-06 NOTE — Progress Notes (Signed)
Vomited x 1 this afternoon,IV zofran 4 mg administered. Would like to try Full liquid for dinner. Will continue to monitor.

## 2017-05-06 NOTE — Procedures (Signed)
Fibroids and adenomyosis  S/p UFE  No comp Stable Overnight recovery Full report in pacs EBL 5cc

## 2017-05-06 NOTE — Progress Notes (Addendum)
Bil. Groin dressing intact,no bleeding, no hematoma. Has 2+ pedal pulses bil. And 2+ posterior pulses.

## 2017-05-06 NOTE — Progress Notes (Signed)
Patient ID: Kathryn Lucero, female   DOB: [birth year], 51 y.o.   MRN: [id number] Patient doing fairly well;does have some intermittent pelvic cramping as expected. Occasional lightheadedness, possibly from Dilaudid PCA. Vital signs stable, afebrile Puncture sites right and left common femoral arteries clean, dry, nontender, no hematomas. Intact distal pulses bilaterally;yellow urine in Foley A/P: symptomatic uterine fibroids, status post bilateral uterine artery embolization earlier today; for overnight obs for pain control.Dilaudid PCA for pain;DC Foley catheter around 1600; advance diet as tolerated; follow-up in IR clinic with Dr. Annamaria Boots in 1 month.   Rowe Robert, [facility]

## 2017-05-06 NOTE — Progress Notes (Signed)
Pedal pulses 2+,posterior tibial pulses bil 2+, R and L groin  dsg intact, no bleeding,no hematoma.

## 2017-05-06 NOTE — Progress Notes (Addendum)
Dressings to bilateral groin clean,dry and intact.No bleeding,no hematoma. R and L pedal pulses +2,posterior tibial pulse +2

## 2017-05-06 NOTE — Progress Notes (Addendum)
R and L groin dressing clean,dry and intact. No bleeding,no hematoma. Has + 2 pedal pulsed bil and + 2posterior tibial pulses. Denies nausea.

## 2017-05-06 NOTE — H&P (Signed)
    Referring Physician(s): Horvath,M  Supervising Physician: Daryll Brod  Patient Status:  [facility] OP TBA  Chief Complaint:  Symptomatic uterine fibroids  Subjective: Patient familiar to IR service from recent consultation with Dr. Annamaria Boots on 03/12/17 to discuss treatment options for symptomatic uterine fibroids. She was deemed an appropriate candidate for bilateral uterine artery embolization and presents today for the procedure. She currently denies fever,HA,CP, dyspnea, cough, back pain,N/V, dysuria, hematuria/blood in stool.She does have abnormal menstrual bleeding, pelvic discomfort, urinary frequency, abdominal bloating. Past Medical History:  Diagnosis Date  . Fibroids    Past Surgical History:  Procedure Laterality Date  . APPENDECTOMY        Allergies: No known allergies  Medications: Prior to Admission medications   Medication Sig Start Date End Date Taking? Authorizing Provider  ibuprofen (ADVIL,MOTRIN) 200 MG tablet Take 200 mg by mouth every 6 (six) hours as needed. Patient takes for pain    Yes [provider]  norelgestromin-ethinyl estradiol (ORTHO EVRA) 150-20 MCG/24HR transdermal patch Place 1 patch onto the skin once a week.      [provider]     Vital Signs: BP (!) 112/59   Pulse 67   Temp 98.1 F (36.7 C) (Oral)   Resp 16   LMP 05/03/2017   SpO2 100%   Physical Exam awake, alert. Chest clear to auscultation bilaterally. Heart with regular rate and rhythm. Abdomen soft, positive bowel sounds, mild pelvic tenderness to palpation. No lower extremity edema.  Imaging: No results found.  Labs:  CBC:  Recent Labs  05/06/17 0740  WBC 6.9  HGB 9.2*  HCT 32.1*  PLT 312    COAGS:  Recent Labs  05/06/17 0740  INR 0.99  APTT 23*    BMP:  Recent Labs  05/06/17 0740  NA 139  K 4.0  CL 107  CO2 23  GLUCOSE 100*  BUN 12  CALCIUM 8.6*  CREATININE 0.62  GFRNONAA >60  GFRAA >60    LIVER FUNCTION TESTS: No  results for input(s): BILITOT, AST, ALT, ALKPHOS, PROT, ALBUMIN in the last 8760 hours.  Assessment and Plan: Patient with history of symptomatic uterine fibroids; seen in consultation by Dr. Annamaria Boots on 03/12/17 and deemed an appropriate candidate for bilateral uterine artery embolization. She presents today for the procedure. Details/risks of procedure, including but not limited to, internal bleeding, infection, contrast nephropathy, nontarget embolization discussed with patient/spouse with their understanding and consent. Postprocedure she will be admitted to the hospital for overnight observation for pain control.   Electronically Signed: D. Rowe Robert, PA-C 05/06/2017, 8:48 AM   I spent a total of 30 minutes at the the patient's bedside AND on the patient's hospital floor or unit, greater than 50% of which was counseling/coordinating care for bilateral uterine artery embolization

## 2017-05-06 NOTE — Progress Notes (Signed)
Bilateral groin dressing intact,no bleeding,no hematoma. Pedal pulses 2+ and posterior pulse 2+.

## 2017-05-06 NOTE — Progress Notes (Signed)
S/p bil.  uterine artery embolization. R groin dsg intact,no bleeding ,no hematoma. R and L pedal and posterior pedal pulses 2+. Patient is alert and oriented x3. Oriented to room and environment. Vital signs obtained. Will continue to monitor.

## 2017-05-06 NOTE — Progress Notes (Signed)
No hematoma and no bleeding to R and L groin puncture sites. 2+ distal pulses bilateral intact.

## 2017-05-07 ENCOUNTER — Other Ambulatory Visit: Payer: Self-pay | Admitting: Radiology

## 2017-05-07 DIAGNOSIS — N800 Endometriosis of uterus: Secondary | ICD-10-CM | POA: Diagnosis not present

## 2017-05-07 DIAGNOSIS — D259 Leiomyoma of uterus, unspecified: Secondary | ICD-10-CM

## 2017-05-07 MED ORDER — HYDROCODONE-ACETAMINOPHEN 5-325 MG PO TABS
1.0000 | ORAL_TABLET | ORAL | Status: DC | PRN
Start: 2017-05-07 — End: 2017-05-07

## 2017-05-07 MED ORDER — SODIUM CHLORIDE 0.9 % IV SOLN: 250.0000 mL | INTRAVENOUS | Status: DC | PRN

## 2017-05-07 NOTE — Discharge Summary (Signed)
Patient ID: Kathryn Lucero MRN: [id number] DOB/AGE: 1965-12-04 51 y.o.  Admit date: 05/06/2017 Discharge date: 05/07/2017  Supervising Physician: Kathryn Lucero  Patient Status: [facility] - In-pt  Admission Diagnoses: Symptomatic uterine fibroids  Discharge Diagnoses: Symptomatic uterine fibroids, status post successful bilateral uterine artery embolization on 05/06/17 Active Problems:   Fibroids, submucosal  Past Medical History:  Diagnosis Date  . Fibroids    Past Surgical History:  Procedure Laterality Date  . APPENDECTOMY    . IR ANGIOGRAM PELVIS SELECTIVE OR SUPRASELECTIVE  05/06/2017  . IR ANGIOGRAM PELVIS SELECTIVE OR SUPRASELECTIVE  05/06/2017  . IR ANGIOGRAM SELECTIVE EACH ADDITIONAL VESSEL  05/06/2017  . IR ANGIOGRAM SELECTIVE EACH ADDITIONAL VESSEL  05/06/2017  . IR EMBO TUMOR ORGAN ISCHEMIA INFARCT INC GUIDE ROADMAPPING  05/06/2017  . IR US GUIDE VASC ACCESS LEFT  05/06/2017  . IR US GUIDE VASC ACCESS RIGHT  05/06/2017     Discharged Condition: good  Hospital Course: Kathryn Lucero is a 51 year old female who was seen in consultation by Dr. Annamaria Lucero on 03/12/17 to discuss treatment options for symptomatic uterine fibroids. She was deemed an appropriate candidate for bilateral uterine artery embolization and underwent the procedure on 05/06/17. The procedure was performed via IV conscious sedation and without immediate complications. She was subsequently admitted to the hospital for overnight observation for pain control. She was placed on Dilaudid PCA pump. Overnight the patient did fairly well with exception of some intermittent nausea and pelvic cramping. On the morning of discharge she did have some mild lightheadedness upon ambulation which responded well to IV fluid bolus and discontinuation of PCA Dilaudid. She was able to void, ambulate and tolerate her diet without significant difficulty. She was seen by Dr. Barbie Lucero and deemed stable for discharge at this time. She will be scheduled for  follow-up the IR clinic with Dr. Annamaria Lucero in 1 month. She was given prescriptions for ibuprofen 600 mg, #20, no refills, 1 tablet every 6 hours for the next 5 days; Colace 100 mg, #20, no refills, 1 tablet twice daily as needed for constipation; Norco 5/325, #30, no refills, 1-2 tablets every 4-6 hours as needed for moderate to severe pain; Zofran 8 mg, #20, no refills, 1 tablet twice daily as needed for nausea. She will continue current gynecologic care with Dr. Philis Lucero as scheduled. She was told to call our service with any questions or concerns.  Consults: none  Significant Diagnostic Studies:  Results for orders placed or performed during the hospital encounter of 84/13/24  Basic metabolic panel  Result Value Ref Range   Sodium 139 135 - 145 mmol/L   Potassium 4.0 3.5 - 5.1 mmol/L   Chloride 107 101 - 111 mmol/L   CO2 23 22 - 32 mmol/L   Glucose, Bld 100 (H) 65 - 99 mg/dL   BUN 12 6 - 20 mg/dL   Creatinine, Ser 0.62 0.44 - 1.00 mg/dL   Calcium 8.6 (L) 8.9 - 10.3 mg/dL   GFR calc non Af Amer >60 >60 mL/min   GFR calc Af Amer >60 >60 mL/min   Anion gap 9 5 - 15  CBC with Differential/Platelet  Result Value Ref Range   WBC 6.9 4.0 - 10.5 K/uL   RBC 4.55 3.87 - 5.11 MIL/uL   Hemoglobin 9.2 (L) 12.0 - 15.0 g/dL   HCT 32.1 (L) 36.0 - 46.0 %   MCV 70.5 (L) 78.0 - 100.0 fL   MCH 20.2 (L) 26.0 - 34.0 pg   MCHC 28.7 (L) 30.0 -  36.0 g/dL   RDW 19.6 (H) 11.5 - 15.5 %   Platelets 312 150 - 400 K/uL   Neutrophils Relative % 53 %   Lymphocytes Relative 37 %   Monocytes Relative 6 %   Eosinophils Relative 3 %   Basophils Relative 1 %   Neutro Abs 3.6 1.7 - 7.7 K/uL   Lymphs Abs 2.6 0.7 - 4.0 K/uL   Monocytes Absolute 0.4 0.1 - 1.0 K/uL   Eosinophils Absolute 0.2 0.0 - 0.7 K/uL   Basophils Absolute 0.1 0.0 - 0.1 K/uL   RBC Morphology ELLIPTOCYTES   hCG, serum, qualitative  Result Value Ref Range   Preg, Serum NEGATIVE NEGATIVE  APTT upon arrival  Result Value Ref Range   aPTT 23 (L) 24  - 36 seconds  Protime-INR upon arrival  Result Value Ref Range   Prothrombin Time 13.0 11.4 - 15.2 seconds   INR 0.99      Treatments: Successful bilateral uterine artery embolization via IV conscious sedation on 05/06/17  Discharge Exam: Blood pressure 95/61, pulse (!) 58, temperature 98.6 F (37 C), temperature source Oral, resp. rate 16, last menstrual period 05/03/2017, SpO2 98 %. Patient awake, alert. Chest clear to auscultation bilaterally. Heart with slightly bradycardic but regular rhythm. Abdomen soft, positive bowel sounds, minimal pelvic tenderness to palpation. Puncture sites right and left common femoral arteries soft, clean, dry, minimal tenderness, no hematoma. Intact lower extremity pulses and no edema  Disposition: 01-Home or Self Care  Discharge Instructions    Call MD for:  difficulty breathing, headache or visual disturbances    Complete by:  As directed    Call MD for:  extreme fatigue    Complete by:  As directed    Call MD for:  hives    Complete by:  As directed    Call MD for:  persistant dizziness or light-headedness    Complete by:  As directed    Call MD for:  persistant nausea and vomiting    Complete by:  As directed    Call MD for:  redness, tenderness, or signs of infection (pain, swelling, redness, odor or green/yellow discharge around incision site)    Complete by:  As directed    Call MD for:  severe uncontrolled pain    Complete by:  As directed    Call MD for:  temperature >100.4    Complete by:  As directed    Diet - low sodium heart healthy    Complete by:  As directed    Driving Restrictions    Complete by:  As directed    No driving for 48 hours   Increase activity slowly    Complete by:  As directed    Lifting restrictions    Complete by:  As directed    No heavy lifting for the next 3-4 days   Sexual Activity Restrictions    Complete by:  As directed    No sexual intercourse for 1 week     Allergies as of 05/07/2017   No  Active Allergies     Medication List    You have not been prescribed any medications.          Discharge Care Instructions        Start     Ordered   05/07/17 0000  Increase activity slowly     05/07/17 1616   05/07/17 0000  Diet - low sodium heart healthy     05/07/17 1616  05/07/17 0000  Driving Restrictions    Comments:  No driving for 48 hours   05/07/17 1616   05/07/17 0000  Lifting restrictions    Comments:  No heavy lifting for the next 3-4 days   05/07/17 1616   05/07/17 0000  Sexual Activity Restrictions    Comments:  No sexual intercourse for 1 week   05/07/17 1616   05/07/17 0000  Call MD for:  temperature >100.4     05/07/17 1616   05/07/17 0000  Call MD for:  persistant nausea and vomiting     05/07/17 1616   05/07/17 0000  Call MD for:  severe uncontrolled pain     05/07/17 1616   05/07/17 0000  Call MD for:  redness, tenderness, or signs of infection (pain, swelling, redness, odor or green/yellow discharge around incision site)     05/07/17 1616   05/07/17 0000  Call MD for:  difficulty breathing, headache or visual disturbances     05/07/17 1616   05/07/17 0000  Call MD for:  hives     05/07/17 1616   05/07/17 0000  Call MD for:  persistant dizziness or light-headedness     05/07/17 1616   05/07/17 0000  Call MD for:  extreme fatigue     05/07/17 1616     Follow-up Information    Greggory Keen, MD Follow up.   Specialty:  Interventional Radiology Why:  Radiology will call you with follow up appointment with Dr. Annamaria Lucero in the IR clinic in 1 month. Call [phone] or [phone] with any questions. Contact information: [address] [place] [address] [phone]        Bobbye Charleston, MD Follow up.   Specialty:  Obstetrics and Gynecology Why:  Continue gynecologic care with Dr. Philis Lucero as scheduled Contact information: [address] [place] [address] [phone]             Electronically Signed: D. Rowe Robert, PA-C 05/07/2017, 4:20 PM   I have spent Less than 30 minutes discharging Dawn.

## 2017-05-07 NOTE — Progress Notes (Addendum)
Ms. Kathryn Lucero ambulated 36' in hallway with some complaint of SOB and dizziness. Back to bed with no other symptoms, no vomiting.

## 2017-05-07 NOTE — Progress Notes (Signed)
Wasted 12.5 mg Dilaudid PCA in sink. Witnessed by Lottie Dawson.

## 2017-05-07 NOTE — Discharge Instructions (Signed)
Uterine Artery Embolization for Fibroids, Care After °Refer to this sheet in the next few weeks. These instructions provide you with information on caring for yourself after your procedure. Your health care provider may also give you more specific instructions. Your treatment has been planned according to current medical practices, but problems sometimes occur. Call your health care provider if you have any problems or questions after your procedure. °What can I expect after the procedure? °After your procedure, it is typical to have cramping in the pelvis. You will be given pain medicine to control it. °Follow these instructions at home: °· Only take over-the-counter or prescription medicines for pain, discomfort, or fever as directed by your health care provider. °· Do not take aspirin. It can cause bleeding. °· Follow your health care provider’s advice regarding medicines given to you, diet, activity, and when to begin sexual activity. °· See your health care provider for follow-up care as directed. °Contact a health care provider if: °· You have a fever. °· You have redness, swelling, and pain around your incision site. °· You have pus draining from your incision. °· You have a rash. °Get help right away if: °· You have bleeding from your incision site. °· You have difficulty breathing. °· You have chest pain. °· You have severe abdominal pain. °· You have leg pain. °· You become dizzy and faint. °This information is not intended to replace advice given to you by your health care provider. Make sure you discuss any questions you have with your health care provider. °Document Released: 06/01/2013 Document Revised: 01/17/2016 Document Reviewed: 02/24/2013 °[employer]vier Interactive Patient Education © 2018 [employer] ° °

## 2017-06-08 ENCOUNTER — Encounter: Payer: Self-pay | Admitting: Interventional Radiology

## 2017-06-09 ENCOUNTER — Ambulatory Visit
Admission: RE | Admit: 2017-06-09 | Discharge: 2017-06-09 | Disposition: A | Discharge status: Home / Self Care | Payer: BLUE CROSS/BLUE SHIELD | Source: Ambulatory Visit | Attending: Radiology | Admitting: Radiology

## 2017-06-09 DIAGNOSIS — D259 Leiomyoma of uterus, unspecified: Secondary | ICD-10-CM

## 2017-06-09 HISTORY — PX: IR RADIOLOGIST EVAL & MGMT: IMG5224

## 2017-06-09 NOTE — Progress Notes (Signed)
Patient ID: Kathryn Lucero, female   DOB: [birth year], 51 y.o.   MRN: [id number]       Chief Complaint:  Symptomatic uterine fibroids, 1 months status post uterine fibroid embolization.   Referring Physician(s): Allred,Darrell K  History of Present Illness: Labrittany Lucero is a 51 y.o. female with systematic uterine fibroids and abnormal menstrual bleeding. She is now approximate 1 month status post uterine fibroid embolization performed 05/06/2017. Over the last month, she has slowly recovered at home. She is back to light duty at work at her [employer]. She had a period 2 weeks ago which was 3 days with lighter flow. She has some residual but improving right pelvic pain and discomfort. Minor spotty pinkish vaginal discharge. This also is improving. No interval fevers. She restarted her birth control pills 2 weeks ago. She is anxious to begin exercise again and advance her work load.  Past Medical History:  Diagnosis Date  . Fibroids     Past Surgical History:  Procedure Laterality Date  . APPENDECTOMY    . IR ANGIOGRAM PELVIS SELECTIVE OR SUPRASELECTIVE  05/06/2017  . IR ANGIOGRAM PELVIS SELECTIVE OR SUPRASELECTIVE  05/06/2017  . IR ANGIOGRAM SELECTIVE EACH ADDITIONAL VESSEL  05/06/2017  . IR ANGIOGRAM SELECTIVE EACH ADDITIONAL VESSEL  05/06/2017  . IR EMBO TUMOR ORGAN ISCHEMIA INFARCT INC GUIDE ROADMAPPING  05/06/2017  . IR RADIOLOGIST EVAL & MGMT  03/12/2017  . IR US GUIDE VASC ACCESS LEFT  05/06/2017  . IR US GUIDE VASC ACCESS RIGHT  05/06/2017    Allergies: Patient has no known allergies.  Medications: Prior to Admission medications   Not on File     Family History  Problem Relation Age of Onset  . Cancer Father        prostate  . Hypertension Father     Social History   Social History  . Marital status: Married    Spouse name: N/A  . Number of children: N/A  . Years of education: N/A   Social History Main Topics  . Smoking status: Never Smoker  . Smokeless tobacco:  Never Used  . Alcohol use No  . Drug use: No  . Sexual activity: Not on file   Other Topics Concern  . Not on file   Social History Narrative  . No narrative on file      Review of Systems: A 12 point ROS discussed and pertinent positives are indicated in the HPI above.  All other systems are negative.  Review of Systems  Vital Signs: BP 100/60   Pulse 76   Temp 97.8 F (36.6 C) (Oral)   Resp 14   Ht 5\' 2"  (1.575 m)   Wt 125 lb (56.7 kg)   LMP 05/26/2017 (Approximate)   SpO2 98%   BMI 22.86 kg/m   Physical Exam  Constitutional: She appears well-developed and well-nourished. No distress.  Eyes: Conjunctivae are normal. No scleral icterus.  Cardiovascular: Intact distal pulses.   Symmetric normal femoral and pedal pulses  Abdominal: Soft. Bowel sounds are normal. She exhibits no distension and no mass. There is no tenderness. There is no guarding.  Skin: She is not diaphoretic.     Imaging: No results found.  Labs:  CBC:  Recent Labs  05/06/17 0740  WBC 6.9  HGB 9.2*  HCT 32.1*  PLT 312    COAGS:  Recent Labs  05/06/17 0740  INR 0.99  APTT 23*    BMP:  Recent Labs  05/06/17 0740  NA  139  K 4.0  CL 107  CO2 23  GLUCOSE 100*  BUN 12  CALCIUM 8.6*  CREATININE 0.62  GFRNONAA >60  GFRAA >60    LIVER FUNCTION TESTS: No results for input(s): BILITOT, AST, ALT, ALKPHOS, PROT, ALBUMIN in the last 8760 hours.   Assessment and Plan:  1 month status post uterine fibroid embolization. Slow recovery at home over the last month but she is improving clinically. Mild residual right pelvic cramping and pain. She currently is back to work with light duty. She is not taking any pain medications. No significant abnormal discharge or fevers to suggest infection. She was encouraged to advance her activity and begin her exercise regimen and advance her work load.  Follow-up by telephone at 3 months. Repeat outpatient MRI with an office visit at 6 months.  She knows to call with any questions.   Electronically Signed: Greggory Keen 06/09/2017, 8:43 AM   I spent a total of    25 Minutes in face to face in clinical consultation, greater than 50% of which was counseling/coordinating care for this patient status post UFE

## 2017-06-24 ENCOUNTER — Encounter: Payer: Self-pay | Admitting: Interventional Radiology

## 2017-07-28 ENCOUNTER — Telehealth: Payer: Self-pay | Admitting: Radiology

## 2017-07-28 NOTE — Telephone Encounter (Signed)
Left message:  call for status update 3 mo post [place].  Arizbeth Cawthorn Riki Rusk, RN 07/28/2017 8:59 AM

## 2017-11-03 ENCOUNTER — Other Ambulatory Visit (HOSPITAL_COMMUNITY): Payer: Self-pay | Admitting: Interventional Radiology

## 2017-11-03 DIAGNOSIS — D219 Benign neoplasm of connective and other soft tissue, unspecified: Secondary | ICD-10-CM

## 2017-11-24 ENCOUNTER — Other Ambulatory Visit (HOSPITAL_COMMUNITY): Payer: Self-pay | Admitting: Interventional Radiology

## 2017-12-02 ENCOUNTER — Ambulatory Visit
Admission: RE | Admit: 2017-12-02 | Discharge: 2017-12-02 | Disposition: A | Discharge status: Home / Self Care | Payer: BLUE CROSS/BLUE SHIELD | Source: Ambulatory Visit | Attending: Interventional Radiology | Admitting: Interventional Radiology

## 2017-12-02 ENCOUNTER — Ambulatory Visit (HOSPITAL_COMMUNITY)
Admission: RE | Admit: 2017-12-02 | Discharge: 2017-12-02 | Disposition: A | Discharge status: Home / Self Care | Payer: BLUE CROSS/BLUE SHIELD | Source: Ambulatory Visit | Attending: Interventional Radiology | Admitting: Interventional Radiology

## 2017-12-02 DIAGNOSIS — D259 Leiomyoma of uterus, unspecified: Secondary | ICD-10-CM | POA: Diagnosis not present

## 2017-12-02 DIAGNOSIS — D219 Benign neoplasm of connective and other soft tissue, unspecified: Secondary | ICD-10-CM

## 2017-12-02 DIAGNOSIS — N800 Endometriosis of uterus: Secondary | ICD-10-CM | POA: Insufficient documentation

## 2017-12-02 DIAGNOSIS — N83202 Unspecified ovarian cyst, left side: Secondary | ICD-10-CM | POA: Diagnosis not present

## 2017-12-02 DIAGNOSIS — N83201 Unspecified ovarian cyst, right side: Secondary | ICD-10-CM | POA: Insufficient documentation

## 2017-12-02 HISTORY — PX: IR RADIOLOGIST EVAL & MGMT: IMG5224

## 2017-12-02 MED ORDER — GADOBENATE DIMEGLUMINE 529 MG/ML IV SOLN
15.0000 mL | Freq: Once | INTRAVENOUS | Status: AC | PRN
  Administered 2017-12-02: 11 mL via INTRAVENOUS

## 2017-12-02 NOTE — Progress Notes (Signed)
Patient ID: Kathryn Lucero, female   DOB: [birth year], 52 y.o.   MRN: [id number]         Chief Complaint: 57-month status post uterine fibroid embolization, outpatient follow-up.  Referring Physician(s): Joron Velis  History of Present Illness: Kathryn Lucero is a 52 y.o. female who is now 42-month status post uterine fibroid embolization for abnormal menstrual bleeding.  She has recovered over the last several months from very well.  Her symptoms have resolved.  Review of her menstrual cycle reviewed.  Her cycle now last up to 4 days with very light flow described.  No interperiod Bleeding.  No passage of blood clots.  No significant associated abdominal pain, pelvic discomfort or pressure.  Urinary frequency has improved.  Overall she is doing very well.  No new complaints.  Past Medical History:  Diagnosis Date  . Fibroids     Past Surgical History:  Procedure Laterality Date  . APPENDECTOMY    . IR ANGIOGRAM PELVIS SELECTIVE OR SUPRASELECTIVE  05/06/2017  . IR ANGIOGRAM PELVIS SELECTIVE OR SUPRASELECTIVE  05/06/2017  . IR ANGIOGRAM SELECTIVE EACH ADDITIONAL VESSEL  05/06/2017  . IR ANGIOGRAM SELECTIVE EACH ADDITIONAL VESSEL  05/06/2017  . IR EMBO TUMOR ORGAN ISCHEMIA INFARCT INC GUIDE ROADMAPPING  05/06/2017  . IR RADIOLOGIST EVAL & MGMT  03/12/2017  . IR RADIOLOGIST EVAL & MGMT  06/09/2017  . IR RADIOLOGIST EVAL & MGMT  12/02/2017  . IR US GUIDE VASC ACCESS LEFT  05/06/2017  . IR US GUIDE VASC ACCESS RIGHT  05/06/2017    Allergies: Patient has no known allergies.  Medications: Prior to Admission medications   Not on File     Family History  Problem Relation Age of Onset  . Cancer Father        prostate  . Hypertension Father     Social History   Socioeconomic History  . Marital status: Married    Spouse name: Not on file  . Number of children: Not on file  . Years of education: Not on file  . Highest education level: Not on file  Occupational History  . Not on file    Social Needs  . Financial resource strain: Not on file  . Food insecurity:    Worry: Not on file    Inability: Not on file  . Transportation needs:    Medical: Not on file    Non-medical: Not on file  Tobacco Use  . Smoking status: Never Smoker  . Smokeless tobacco: Never Used  Substance and Sexual Activity  . Alcohol use: No  . Drug use: No  . Sexual activity: Not on file  Lifestyle  . Physical activity:    Days per week: Not on file    Minutes per session: Not on file  . Stress: Not on file  Relationships  . Social connections:    Talks on phone: Not on file    Gets together: Not on file    Attends religious service: Not on file    Active member of club or organization: Not on file    Attends meetings of clubs or organizations: Not on file    Relationship status: Not on file  Other Topics Concern  . Not on file  Social History Narrative  . Not on file     Review of Systems: A 12 point ROS discussed and pertinent positives are indicated in the HPI above.  All other systems are negative.  Review of Systems  Vital Signs: BP 103/68 (BP  Location: Right Arm, Patient Position: Sitting, Cuff Size: Normal)   Pulse 80   Temp 97.7 F (36.5 C)   Resp 16   LMP 11/15/2017   SpO2 99%   Physical Exam Deferred today.  She is here to review her posttreatment MRI.   Imaging: Ir Radiologist Eval & Mgmt  Result Date: 12/02/2017 Please refer to notes tab for details about interventional procedure. (Op Note)   Labs:  CBC: Recent Labs    05/06/17 0740  WBC 6.9  HGB 9.2*  HCT 32.1*  PLT 312    COAGS: Recent Labs    05/06/17 0740  INR 0.99  APTT 23*    BMP: Recent Labs    05/06/17 0740  NA 139  K 4.0  CL 107  CO2 23  GLUCOSE 100*  BUN 12  CALCIUM 8.6*  CREATININE 0.62  GFRNONAA >60  GFRAA >60    LIVER FUNCTION TESTS: No results for input(s): BILITOT, AST, ALT, ALKPHOS, PROT, ALBUMIN in the last 8760 hours.  TUMOR MARKERS: No results for  input(s): AFPTM, CEA, CA199, CHROMGRNA in the last 8760 hours.  Assessment and Plan:  51-month status post uterine fibroid embolization.  Post treatment MRI today demonstrates significant reduction in the overall uterine size.  Dominant anterior fundal fibroid is smaller and has no residual enhancement.  MRI findings are concordant with her clinical outcome.  Bilateral ovarian cysts are noted.  MRI imaging pre-and posttreatment were reviewed with the patient today.  All questions were addressed.  She was encouraged to continue her annual GYN appointments.  Follow-up with interventional radiology as needed for any recurrent symptoms.  Thank you for this interesting consult.  I greatly enjoyed meeting [name] and look forward to participating in their care.  A copy of this report was sent to the requesting provider on this date.  Electronically Signed: Greggory Keen 12/02/2017, 9:49 AM   I spent a total of    25 Minutes in face to face in clinical consultation, greater than 50% of which was counseling/coordinating care for this patient 57-month status post uterine fibroid in position.

## 2019-04-18 ENCOUNTER — Other Ambulatory Visit: Payer: Self-pay | Admitting: Obstetrics and Gynecology

## 2019-04-18 DIAGNOSIS — R928 Other abnormal and inconclusive findings on diagnostic imaging of breast: Secondary | ICD-10-CM

## 2019-04-21 ENCOUNTER — Ambulatory Visit
Admission: RE | Admit: 2019-04-21 | Discharge: 2019-04-21 | Disposition: A | Discharge status: Home / Self Care | Payer: BLUE CROSS/BLUE SHIELD | Source: Ambulatory Visit | Attending: Obstetrics and Gynecology | Admitting: Obstetrics and Gynecology

## 2019-04-21 ENCOUNTER — Ambulatory Visit
Admission: RE | Admit: 2019-04-21 | Discharge: 2019-04-21 | Disposition: A | Discharge status: Home / Self Care | Payer: 59 | Source: Ambulatory Visit | Attending: Obstetrics and Gynecology | Admitting: Obstetrics and Gynecology

## 2019-04-21 ENCOUNTER — Other Ambulatory Visit: Payer: Self-pay

## 2019-04-21 DIAGNOSIS — R928 Other abnormal and inconclusive findings on diagnostic imaging of breast: Secondary | ICD-10-CM

## 2020-07-02 ENCOUNTER — Ambulatory Visit: Payer: 59

## 2020-07-09 ENCOUNTER — Ambulatory Visit: Payer: Self-pay | Attending: Internal Medicine

## 2020-07-09 DIAGNOSIS — Z23 Encounter for immunization: Secondary | ICD-10-CM

## 2020-07-09 NOTE — Progress Notes (Signed)
   Covid-19 Vaccination Clinic  Name:  Kathryn Lucero    MRN: [id number] DOB: 10-15-1965  07/09/2020  [name] was observed post Covid-19 immunization for 15 minutes without incident. She was provided with Vaccine Information Sheet and instruction to access the V-Safe system.   [name] was instructed to call 911 with any severe reactions post vaccine: Marland Kitchen Difficulty breathing  . Swelling of face and throat  . A fast heartbeat  . A bad rash all over body  . Dizziness and weakness   Immunizations Administered    Name Date Dose VIS Date Route   Pfizer COVID-19 Vaccine 07/09/2020  4:44 PM 0.3 mL 06/13/2020 Intramuscular   Manufacturer: [employer]   Lot: [id number]   [place]: [id number]

## 2021-01-14 IMAGING — MG DIGITAL DIAGNOSTIC BILATERAL MAMMOGRAM WITH TOMO AND CAD
8 series · 8 of 24 positions shown · non-contrast
Comparison: 03/16/2018 and earlier

CLINICAL DATA: The patient returns after screening study for
evaluation of bilateral masses.

EXAM:
DIGITAL DIAGNOSTIC BILATERAL MAMMOGRAM WITH CAD AND TOMO
ULTRASOUND BILATERAL BREAST

[R CC synth-2D]
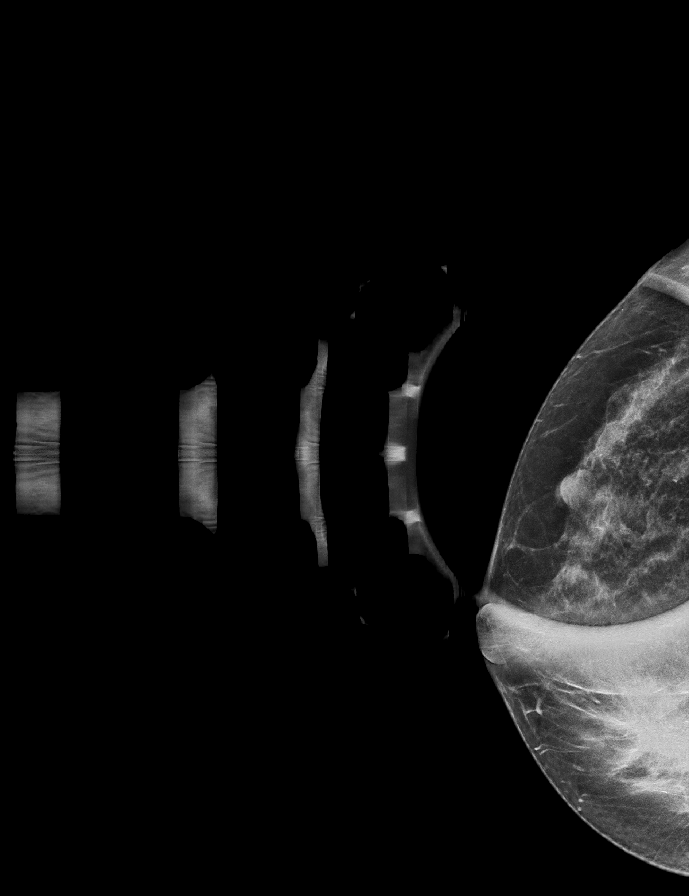

[R MLO synth-2D]
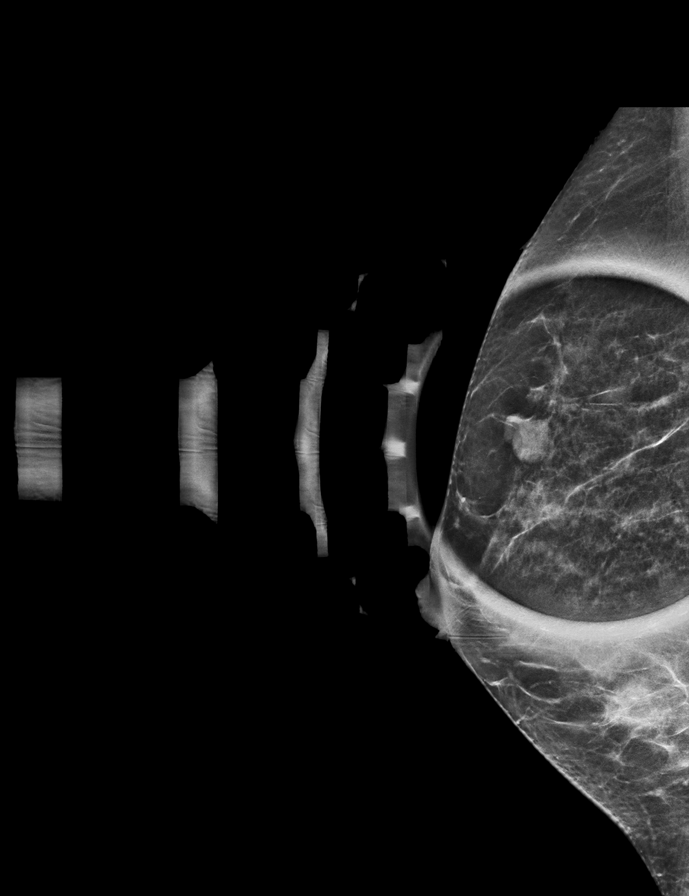

[L MLO synth-2D]
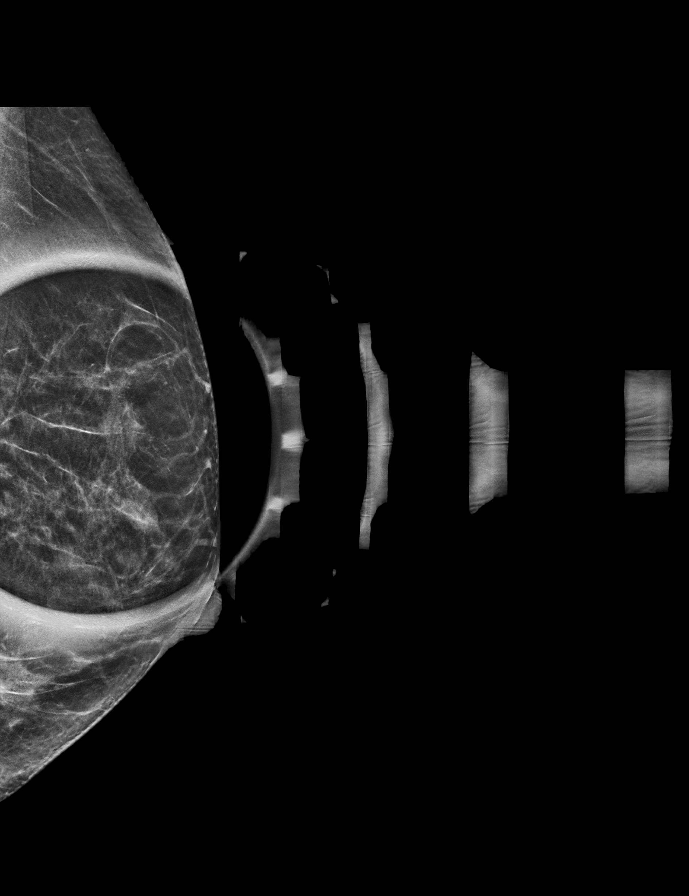

[L CC synth-2D]
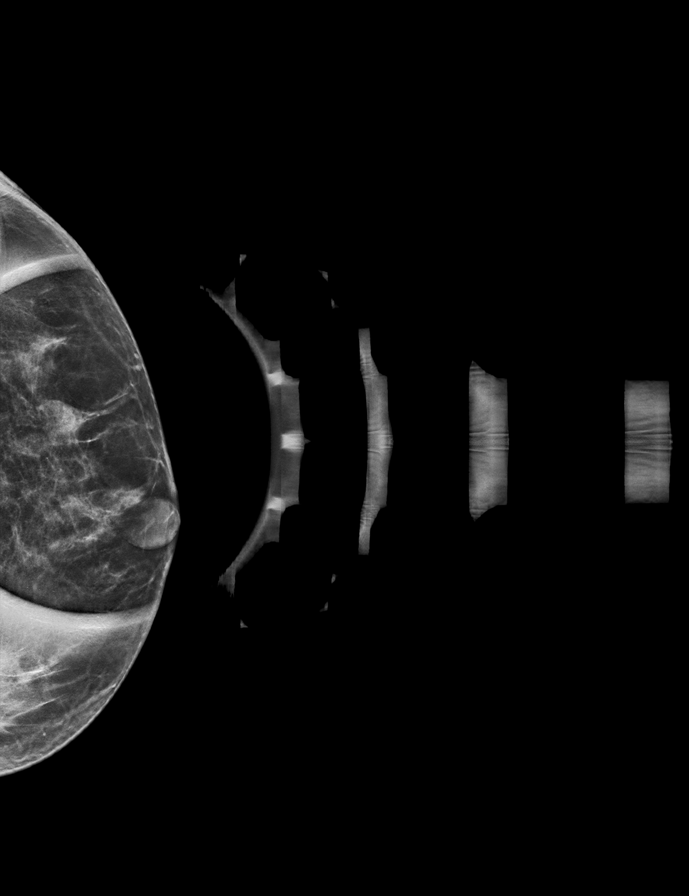

[R CC tomo · tomo slice 26/51.0]
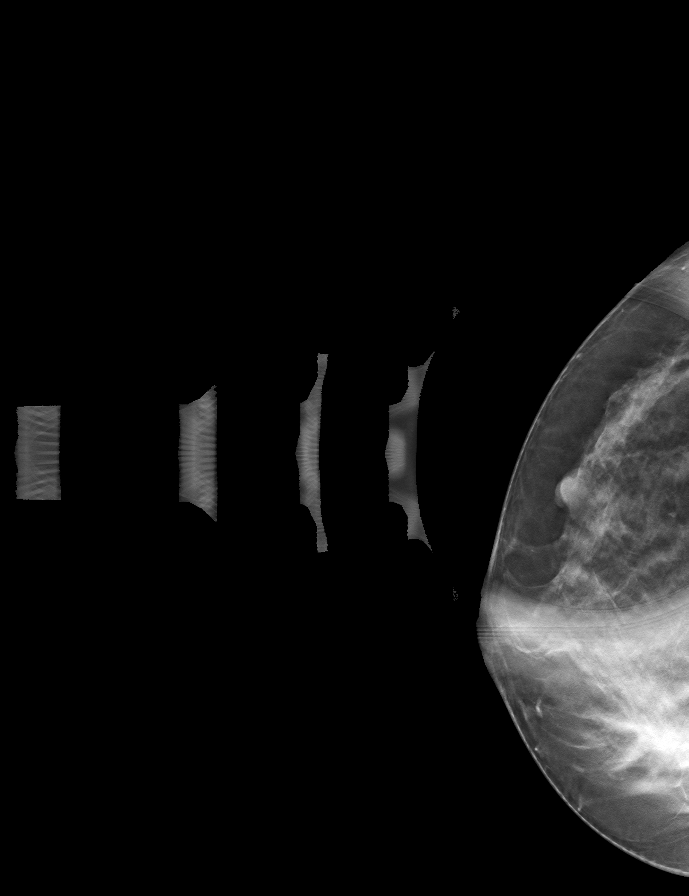

[R MLO tomo · tomo slice 25/49.0]
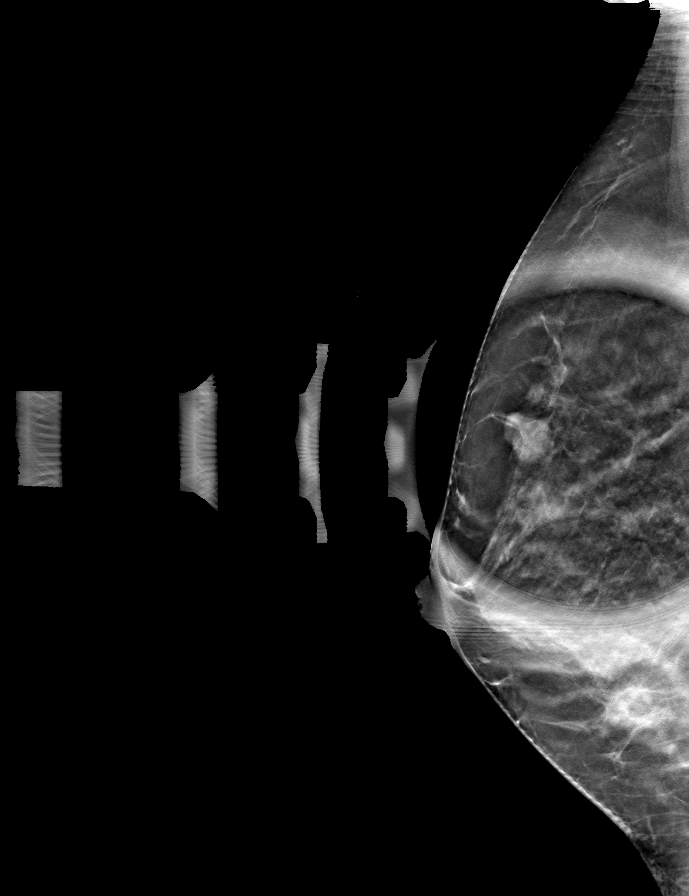

[L CC tomo · tomo slice 25/48.0]
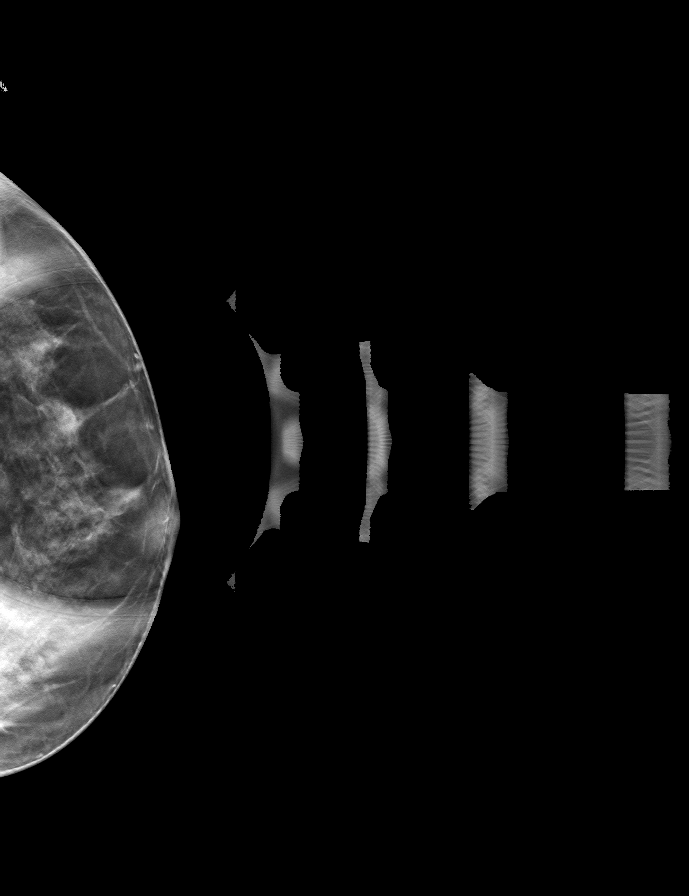

[L MLO tomo · tomo slice 24/47.0]
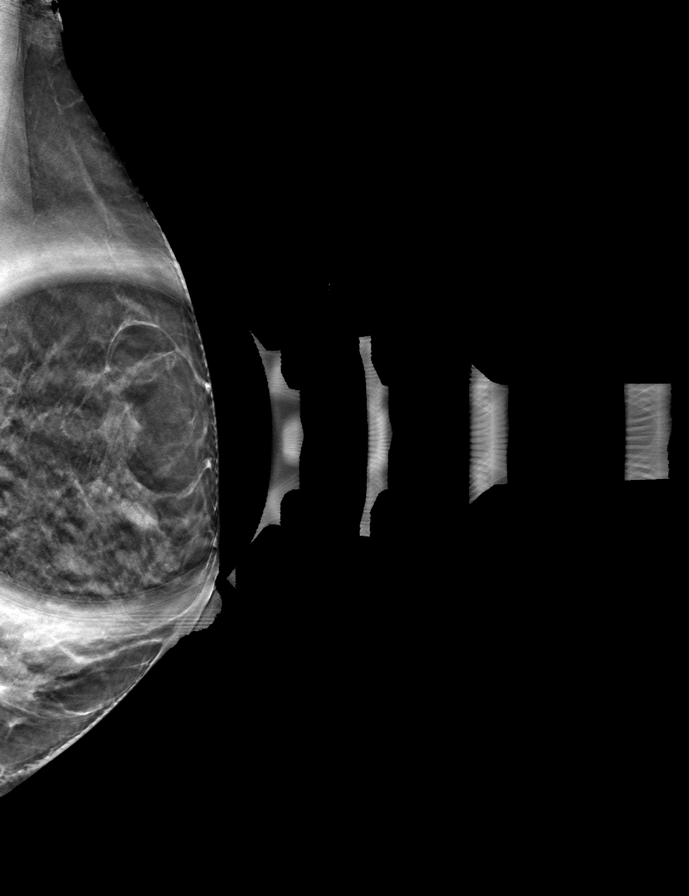

[8 of 24 positions shown; findings below may reference images not displayed]

ACR Breast Density Category c: The breast tissue is heterogeneously
dense, which may obscure small masses.
FINDINGS: Right breast:

Mammogram: Additional 2-D and 3-D images are performed. These views
confirm presence of a circumscribed oval mass in the LATERAL aspect
of the RIGHT breast. Mammographic images were processed with CAD.

Ultrasound: Targeted ultrasound is performed, showing a simple cyst
in the 10 o'clock location of the RIGHT breast 4 centimeters from
the nipple which measures 1.2 x 0.3 x 0.8 centimeters.

Left breast:

Mammogram: Additional 2-D and 3-D images are performed. These views
confirm presence of a circumscribed oval mass in the LATERAL aspect
of the LEFT breast. Mammographic images were processed with CAD.

Ultrasound: Targeted ultrasound is performed, showing a simple cyst
in 1 o'clock location of the LEFT breast 1 centimeter from the
nipple which measures 1.1 x 0.5 x 0.7 centimeters. No solid
components or areas of acoustic shadowing.
IMPRESSION: Bilateral simple cyst. No mammographic or ultrasound evidence for
malignancy.

RECOMMENDATION:
Screening mammogram in one year.(Code:D2-S-FQ5)

I have discussed the findings and recommendations with the patient.
Results were also provided in writing at the conclusion of the
visit. If applicable, a reminder letter will be sent to the patient
regarding the next appointment.

BI-RADS CATEGORY  2: Benign.

## 2021-06-13 ENCOUNTER — Other Ambulatory Visit: Payer: Self-pay

## 2021-06-13 ENCOUNTER — Encounter (HOSPITAL_BASED_OUTPATIENT_CLINIC_OR_DEPARTMENT_OTHER): Payer: Self-pay | Admitting: Obstetrics and Gynecology

## 2021-06-13 DIAGNOSIS — Z01818 Encounter for other preprocedural examination: Secondary | ICD-10-CM | POA: Diagnosis not present

## 2021-06-13 DIAGNOSIS — D250 Submucous leiomyoma of uterus: Secondary | ICD-10-CM | POA: Diagnosis not present

## 2021-06-13 NOTE — Progress Notes (Signed)
YOU ARE SCHEDULED FOR A COVID TEST ON  06-17-2021   . THIS TEST MUST BE DONE BEFORE SURGERY. GO TO  [facility] @ [place]   PHONE NUMBER [phone].   TURN LEFT AT THE SHIPPING AND RECEIVING SIGN AND LOOK FOR SMALL BLUE POP UP TENT UNDER BUILDING OVERHANG AT BACK OF BUILDING AND REMAIN IN YOUR CAR, THIS IS A DRIVE UP TEST.  AFTER YOUR COVID TEST , PLEASE WEAR A MASK OUT IN PUBLIC AND SOCIAL DISTANCE AND [place] YOUR HANDS FREQUENTLY. PLEASE ASK ALL YOUR CLOSE HOUSEHOLD CONTACT TO WEAR MASK OUT IN PUBLIC AND SOCIAL DISTANCE AND [place] HANDS FREQUENTLY ALSO.      Your procedure is scheduled on 06-19-2021  Report to [facility].   Call this number if you have problems the morning of surgery  :[phone].   OUR ADDRESS IS [address].  WE ARE LOCATED IN [facility].  PLEASE BRING YOUR INSURANCE CARD AND PHOTO ID DAY OF SURGERY.  ONLY ONE PERSON ALLOWED IN FACILITY WAITING AREA.                                     REMEMBER:  DO NOT EAT FOOD, CANDY GUM OR MINTS  AFTER MIDNIGHT . YOU MAY HAVE WATER  FROM MIDNIGHT UNTIL 700 AM. NO WATER  AFTER  700 AM DAY OF SURGERY.    YOU MAY  BRUSH YOUR TEETH MORNING OF SURGERY AND RINSE YOUR MOUTH OUT, NO CHEWING GUM CANDY OR MINTS.    _____________________________________________________________________     TAKE THESE MEDICATIONS MORNING OF SURGERY WITH A SIP OF WATER:  NONE  ONE VISITOR IS ALLOWED IN WAITING ROOM ONLY DAY OF SURGERY.  YOU MAY HAVE ANOTHER PERSON SWITCH OUT WITH THE  1  VISITOR IN THE WAITING ROOM DAY OF SURGERY AND A MASK MUST BE WORN IN THE WAITING ROOM.    2 VISITORS  MAY VISIT IN THE EXTENDED RECOVERY ROOM UNTIL 800 PM ONLY 1 VISITOR AGE 26 AND OVER MAY SPEND THE NIGHT AND MUST BE IN EXTENDED RECOVERY ROOM NO LATER THAN 800 PM .   UP TO 2 CHILDREN AGE 68 TO 15 MAY ALSO VISIT IN EXTENDED RECOVERY ROOM ONLY UNTIL 800 PM AND MUST LEAVE BY  800 PM. ALL PERSONS VISITING IN EXTENDED RECOVERY ROOM MUST WEAR A MASK.                                    DO NOT WEAR JEWERLY, MAKE UP. DO NOT WEAR LOTIONS, POWDERS, PERFUMES OR NAIL POLISH. DO NOT SHAVE FOR 48 HOURS PRIOR TO DAY OF SURGERY. MEN MAY SHAVE FACE AND NECK. CONTACTS, GLASSES, OR DENTURES MAY NOT BE WORN TO SURGERY.                                    [facility] IS NOT RESPONSIBLE  FOR ANY BELONGINGS.                                                                    Marland Kitchen  [facility] - Preparing for Surgery Before surgery, you can play an important role.  Because skin is not sterile, your skin needs to be as free of germs as possible.  You can reduce the number of germs on your skin by washing with CHG (chlorahexidine gluconate) soap before surgery.  CHG is an antiseptic cleaner which kills germs and bonds with the skin to continue killing germs even after washing. Please DO NOT use if you have an allergy to CHG or antibacterial soaps.  If your skin becomes reddened/irritated stop using the CHG and inform your nurse when you arrive at Short Stay. Do not shave (including legs and underarms) for at least 48 hours prior to the first CHG shower.  You may shave your face/neck. Please follow these instructions carefully:  1.  Shower with CHG Soap the night before surgery and the  morning of Surgery.  2.  If you choose to wash your hair, wash your hair first as usual with your  normal  shampoo.  3.  After you shampoo, rinse your hair and body thoroughly to remove the  shampoo.                            4.  Use CHG as you would any other liquid soap.  You can apply chg directly  to the skin and wash                      Gently with a scrungie or clean washcloth.  5.  Apply the CHG Soap to your body ONLY FROM THE NECK DOWN.   Do not use on face/ open                           Wound or open sores. Avoid contact with eyes, ears mouth and genitals (private parts).                        Wash face,  Genitals (private parts) with your normal soap.             6.  Wash thoroughly, paying special attention to the area where your surgery  will be performed.  7.  Thoroughly rinse your body with warm water from the neck down.  8.  DO NOT shower/wash with your normal soap after using and rinsing off  the CHG Soap.                9.  Pat yourself dry with a clean towel.            10.  Wear clean pajamas.            11.  Place clean sheets on your bed the night of your first shower and do not  sleep with pets. Day of Surgery : Do not apply any lotions/deodorants the morning of surgery.  Please wear clean clothes to the hospital/surgery center.  FAILURE TO FOLLOW THESE INSTRUCTIONS MAY RESULT IN THE CANCELLATION OF YOUR SURGERY PATIENT SIGNATURE_________________________________  NURSE SIGNATURE__________________________________  ________________________________________________________________________                                                        QUESTIONS Hansel Feinstein  PRE OP NURSE PHONE [phone].

## 2021-06-13 NOTE — Progress Notes (Addendum)
Spoke w/ via phone for pre-op interview---pt Lab needs dos----  urine preg             Lab results------lab appt 06-17-2021 830 am for cbc t & s COVID test -----06-17-2021 extended recovery Arrive 800 am 10-25 NPO after MN NO Solid Food.  Water  from MN until---700 am then npo. Med rec completed Medications to take morning of surgery -----none Diabetic medication -----n/a Patient instructed no nail polish to be worn day of surgery Patient instructed to bring photo id and insurance card day of surgery Patient aware to have Driver (ride ) / caregiver    for 24 hours after surgery driver spuse hiep pham Patient Special Instructions -----pt given extended recovery instructions Pre-Op special Istructions -----n/a Patient verbalized understanding of instructions that were given at this phone interview. Patient denies shortness of breath, chest pain, fever, cough at this phone interview.   Pt speaks fluent english and [place], no interpreter needed  Addendum: left voice mail message for schedule nurse for dr Philis Pique, please let dr Philis Pique know pt hemaglobin was 9.2 at pre op  labs today

## 2021-06-13 NOTE — Progress Notes (Signed)
Left message with scheduler nurse vm, is pt ambulatory surgery or entended recovery? And need surgery orders in epic

## 2021-06-14 ENCOUNTER — Other Ambulatory Visit: Payer: Self-pay | Admitting: Obstetrics and Gynecology

## 2021-06-14 DIAGNOSIS — Z01818 Encounter for other preprocedural examination: Secondary | ICD-10-CM

## 2021-06-17 ENCOUNTER — Encounter (HOSPITAL_COMMUNITY)
Admission: RE | Admit: 2021-06-17 | Discharge: 2021-06-17 | Disposition: A | Discharge status: Home / Self Care | Payer: BC Managed Care – PPO | Source: Ambulatory Visit | Attending: Obstetrics and Gynecology | Admitting: Obstetrics and Gynecology

## 2021-06-17 ENCOUNTER — Other Ambulatory Visit: Payer: Self-pay | Admitting: Obstetrics and Gynecology

## 2021-06-17 ENCOUNTER — Other Ambulatory Visit: Payer: Self-pay

## 2021-06-17 VITALS — BP 115/62 | HR 67 | Temp 98.0°F | Resp 18 | Ht 62.0 in | Wt 126.5 lb

## 2021-06-17 DIAGNOSIS — G44209 Tension-type headache, unspecified, not intractable: Secondary | ICD-10-CM

## 2021-06-17 DIAGNOSIS — D250 Submucous leiomyoma of uterus: Secondary | ICD-10-CM

## 2021-06-17 DIAGNOSIS — Z01818 Encounter for other preprocedural examination: Secondary | ICD-10-CM

## 2021-06-17 DIAGNOSIS — G4700 Insomnia, unspecified: Secondary | ICD-10-CM

## 2021-06-17 LAB — CBC
HCT: 40.2 % (ref 36.0–46.0)
Hemoglobin: 12.5 g/dL (ref 12.0–15.0)
MCH: 27.1 pg (ref 26.0–34.0)
MCHC: 31.1 g/dL (ref 30.0–36.0)
MCV: 87.2 fL (ref 80.0–100.0)
Platelets: 300 10*3/uL (ref 150–400)
RBC: 4.61 MIL/uL (ref 3.87–5.11)
RDW: 14.6 % (ref 11.5–15.5)
WBC: 7.5 10*3/uL (ref 4.0–10.5)
nRBC: 0 % (ref 0.0–0.2)

## 2021-06-18 ENCOUNTER — Other Ambulatory Visit (HOSPITAL_COMMUNITY): Payer: Self-pay

## 2021-06-18 LAB — SARS CORONAVIRUS 2 (TAT 6-24 HRS): SARS Coronavirus 2: NEGATIVE

## 2021-06-19 ENCOUNTER — Ambulatory Visit (HOSPITAL_BASED_OUTPATIENT_CLINIC_OR_DEPARTMENT_OTHER): Payer: BC Managed Care – PPO | Admitting: Anesthesiology

## 2021-06-19 ENCOUNTER — Ambulatory Visit (HOSPITAL_BASED_OUTPATIENT_CLINIC_OR_DEPARTMENT_OTHER)
Admission: RE | Admit: 2021-06-19 | Discharge: 2021-06-19 | Disposition: A | Discharge status: Home / Self Care | Payer: BC Managed Care – PPO | Source: Ambulatory Visit | Attending: Obstetrics and Gynecology | Admitting: Obstetrics and Gynecology

## 2021-06-19 ENCOUNTER — Encounter (HOSPITAL_BASED_OUTPATIENT_CLINIC_OR_DEPARTMENT_OTHER)
Admission: RE | Disposition: A | Discharge status: Home / Self Care | Payer: Self-pay | Source: Ambulatory Visit | Attending: Obstetrics and Gynecology

## 2021-06-19 ENCOUNTER — Encounter (HOSPITAL_BASED_OUTPATIENT_CLINIC_OR_DEPARTMENT_OTHER): Payer: Self-pay | Admitting: Obstetrics and Gynecology

## 2021-06-19 ENCOUNTER — Other Ambulatory Visit: Payer: Self-pay

## 2021-06-19 DIAGNOSIS — Z01818 Encounter for other preprocedural examination: Secondary | ICD-10-CM

## 2021-06-19 DIAGNOSIS — N8003 Adenomyosis of the uterus: Secondary | ICD-10-CM | POA: Insufficient documentation

## 2021-06-19 DIAGNOSIS — N813 Complete uterovaginal prolapse: Secondary | ICD-10-CM | POA: Diagnosis not present

## 2021-06-19 DIAGNOSIS — D259 Leiomyoma of uterus, unspecified: Secondary | ICD-10-CM | POA: Insufficient documentation

## 2021-06-19 HISTORY — PX: CYSTOCELE REPAIR: SHX163

## 2021-06-19 HISTORY — PX: CYSTOSCOPY: SHX5120

## 2021-06-19 HISTORY — PX: VAGINAL HYSTERECTOMY: SHX2639

## 2021-06-19 HISTORY — PX: BLADDER SUSPENSION: SHX72

## 2021-06-19 LAB — TYPE AND SCREEN
ABO/RH(D): A POS
Antibody Screen: NEGATIVE

## 2021-06-19 LAB — POCT PREGNANCY, URINE: Preg Test, Ur: NEGATIVE

## 2021-06-19 LAB — ABO/RH: ABO/RH(D): A POS

## 2021-06-19 SURGERY — HYSTERECTOMY, VAGINAL
Anesthesia: General | Site: Vagina

## 2021-06-19 MED ORDER — OXYCODONE-ACETAMINOPHEN 5-325 MG PO TABS: 1.0000 | ORAL_TABLET | ORAL | 0 refills | Status: AC | PRN

## 2021-06-19 MED ORDER — ESTRADIOL 0.1 MG/GM VA CREA
TOPICAL_CREAM | VAGINAL | Status: DC | PRN
  Administered 2021-06-19: 1 via VAGINAL

## 2021-06-19 MED ORDER — LACTATED RINGERS IV SOLN: INTRAVENOUS | Status: DC

## 2021-06-19 MED ORDER — SOD CITRATE-CITRIC ACID 500-334 MG/5ML PO SOLN: 30.0000 mL | ORAL | Status: DC

## 2021-06-19 MED ORDER — WHITE PETROLATUM EX OINT
TOPICAL_OINTMENT | CUTANEOUS | Status: AC
  Filled 2021-06-19: qty 5

## 2021-06-19 MED ORDER — ROCURONIUM BROMIDE 100 MG/10ML IV SOLN
INTRAVENOUS | Status: DC | PRN
  Administered 2021-06-19 (×3): 10 mg via INTRAVENOUS
  Administered 2021-06-19: 50 mg via INTRAVENOUS

## 2021-06-19 MED ORDER — ROCURONIUM BROMIDE 10 MG/ML (PF) SYRINGE
PREFILLED_SYRINGE | INTRAVENOUS | Status: AC
  Filled 2021-06-19: qty 10

## 2021-06-19 MED ORDER — FENTANYL CITRATE (PF) 100 MCG/2ML IJ SOLN
INTRAMUSCULAR | Status: AC
  Filled 2021-06-19: qty 2

## 2021-06-19 MED ORDER — LIDOCAINE HCL (CARDIAC) PF 100 MG/5ML IV SOSY
PREFILLED_SYRINGE | INTRAVENOUS | Status: DC | PRN
  Administered 2021-06-19: 60 mg via INTRAVENOUS

## 2021-06-19 MED ORDER — DEXAMETHASONE SODIUM PHOSPHATE 10 MG/ML IJ SOLN
INTRAMUSCULAR | Status: AC
  Filled 2021-06-19: qty 1

## 2021-06-19 MED ORDER — PROPOFOL 10 MG/ML IV BOLUS
INTRAVENOUS | Status: AC
  Filled 2021-06-19: qty 20

## 2021-06-19 MED ORDER — HEMOSTATIC AGENTS (NO CHARGE) OPTIME
TOPICAL | Status: DC | PRN
  Administered 2021-06-19: 1 via TOPICAL

## 2021-06-19 MED ORDER — SUGAMMADEX SODIUM 200 MG/2ML IV SOLN
INTRAVENOUS | Status: DC | PRN
  Administered 2021-06-19: 200 mg via INTRAVENOUS

## 2021-06-19 MED ORDER — FENTANYL CITRATE (PF) 100 MCG/2ML IJ SOLN
INTRAMUSCULAR | Status: DC | PRN
  Administered 2021-06-19: 25 ug via INTRAVENOUS
  Administered 2021-06-19 (×2): 50 ug via INTRAVENOUS
  Administered 2021-06-19: 100 ug via INTRAVENOUS
  Administered 2021-06-19: 50 ug via INTRAVENOUS
  Administered 2021-06-19: 25 ug via INTRAVENOUS

## 2021-06-19 MED ORDER — OXYCODONE HCL 5 MG PO TABS
5.0000 mg | ORAL_TABLET | Freq: Four times a day (QID) | ORAL | Status: AC | PRN
  Administered 2021-06-19: 5 mg via ORAL

## 2021-06-19 MED ORDER — ACETAMINOPHEN 10 MG/ML IV SOLN: 1000.0000 mg | Freq: Once | INTRAVENOUS | Status: DC | PRN

## 2021-06-19 MED ORDER — KETOROLAC TROMETHAMINE 30 MG/ML IJ SOLN
INTRAMUSCULAR | Status: AC
  Filled 2021-06-19: qty 1

## 2021-06-19 MED ORDER — CEFAZOLIN SODIUM-DEXTROSE 2-4 GM/100ML-% IV SOLN
INTRAVENOUS | Status: AC
  Filled 2021-06-19: qty 100

## 2021-06-19 MED ORDER — POVIDONE-IODINE 10 % EX SWAB
2.0000 "application " | Freq: Once | CUTANEOUS | Status: AC
  Administered 2021-06-19: 2 via TOPICAL

## 2021-06-19 MED ORDER — CEFAZOLIN SODIUM-DEXTROSE 2-4 GM/100ML-% IV SOLN
2.0000 g | INTRAVENOUS | Status: AC
  Administered 2021-06-19: 2 g via INTRAVENOUS

## 2021-06-19 MED ORDER — MIDAZOLAM HCL 5 MG/5ML IJ SOLN
INTRAMUSCULAR | Status: DC | PRN
  Administered 2021-06-19: 1 mg via INTRAVENOUS

## 2021-06-19 MED ORDER — SODIUM CHLORIDE 0.9 % IR SOLN
Status: DC | PRN
  Administered 2021-06-19: 1200 mL via INTRAVESICAL

## 2021-06-19 MED ORDER — DEXAMETHASONE SODIUM PHOSPHATE 4 MG/ML IJ SOLN
INTRAMUSCULAR | Status: DC | PRN
Start: 2021-06-19 — End: 2021-06-19
  Administered 2021-06-19: 5 mg via INTRAVENOUS

## 2021-06-19 MED ORDER — PROPOFOL 10 MG/ML IV BOLUS
INTRAVENOUS | Status: DC | PRN
  Administered 2021-06-19: 160 mg via INTRAVENOUS

## 2021-06-19 MED ORDER — INDIGOTINDISULFONATE SODIUM 8 MG/ML IJ SOLN
INTRAMUSCULAR | Status: DC | PRN
  Administered 2021-06-19: 40 mg via INTRAVENOUS

## 2021-06-19 MED ORDER — LIDOCAINE-EPINEPHRINE (PF) 1 %-1:200000 IJ SOLN
INTRAMUSCULAR | Status: DC | PRN
  Administered 2021-06-19: 17 mL

## 2021-06-19 MED ORDER — LIDOCAINE 2% (20 MG/ML) 5 ML SYRINGE
INTRAMUSCULAR | Status: AC
  Filled 2021-06-19: qty 5

## 2021-06-19 MED ORDER — ONDANSETRON HCL 4 MG/2ML IJ SOLN
INTRAMUSCULAR | Status: AC
  Filled 2021-06-19: qty 2

## 2021-06-19 MED ORDER — OXYCODONE HCL 5 MG PO TABS
ORAL_TABLET | ORAL | Status: AC
  Filled 2021-06-19: qty 1

## 2021-06-19 MED ORDER — MIDAZOLAM HCL 2 MG/2ML IJ SOLN
INTRAMUSCULAR | Status: AC
  Filled 2021-06-19: qty 2

## 2021-06-19 MED ORDER — FENTANYL CITRATE (PF) 100 MCG/2ML IJ SOLN
25.0000 ug | INTRAMUSCULAR | Status: DC | PRN
  Administered 2021-06-19: 25 ug via INTRAVENOUS

## 2021-06-19 SURGICAL SUPPLY — 40 items
ADH SKN CLS APL DERMABOND .7 (GAUZE/BANDAGES/DRESSINGS) ×2
AGENT HMST KT MTR STRL THRMB (HEMOSTASIS) ×2
BLADE SURG 15 STRL LF DISP TIS (BLADE) ×4 IMPLANT
BLADE SURG 15 STRL SS (BLADE) ×6
COVER LIGHT HANDLE UNIVERSAL (MISCELLANEOUS) ×1 IMPLANT
DECANTER SPIKE VIAL GLASS SM (MISCELLANEOUS) ×1 IMPLANT
DERMABOND ADVANCED (GAUZE/BANDAGES/DRESSINGS) ×1
DERMABOND ADVANCED .7 DNX12 (GAUZE/BANDAGES/DRESSINGS) ×2 IMPLANT
GAUZE 4X4 16PLY ~~LOC~~+RFID DBL (SPONGE) ×4 IMPLANT
GAUZE PACKING 2X5 YD STRL (GAUZE/BANDAGES/DRESSINGS) ×3 IMPLANT
GLOVE SURG ENC MOIS LTX SZ7 (GLOVE) ×3 IMPLANT
GLOVE SURG UNDER POLY LF SZ6.5 (GLOVE) ×3 IMPLANT
GOWN STRL REUS W/TWL LRG LVL3 (GOWN DISPOSABLE) ×12 IMPLANT
IV NS 1000ML (IV SOLUTION) ×6
IV NS 1000ML BAXH (IV SOLUTION) IMPLANT
KIT TURNOVER CYSTO (KITS) ×3 IMPLANT
MARKER SKIN DUAL TIP RULER LAB (MISCELLANEOUS) IMPLANT
NDL SPNL 22GX3.5 QUINCKE BK (NEEDLE) IMPLANT
NEEDLE HYPO 22GX1.5 SAFETY (NEEDLE) ×3 IMPLANT
NEEDLE SPNL 22GX3.5 QUINCKE BK (NEEDLE) IMPLANT
NS IRRIG 1000ML POUR BTL (IV SOLUTION) ×3 IMPLANT
PACK VAGINAL WOMENS (CUSTOM PROCEDURE TRAY) ×3 IMPLANT
PAD OB MATERNITY 4.3X12.25 (PERSONAL CARE ITEMS) ×3 IMPLANT
SET IRRIG Y TYPE TUR BLADDER L (SET/KITS/TRAYS/PACK) ×3 IMPLANT
SLING TRANS VAGINAL TAPE (Sling) ×1 IMPLANT
SLING UTERINE/ABD GYNECARE TVT (Sling) ×2 IMPLANT
SURGIFLO W/THROMBIN 8M KIT (HEMOSTASIS) ×1 IMPLANT
SUT VIC AB 0 CT1 18XCR BRD8 (SUTURE) ×4 IMPLANT
SUT VIC AB 0 CT1 8-18 (SUTURE) ×15
SUT VIC AB 2-0 CT1 (SUTURE) ×3 IMPLANT
SUT VIC AB 2-0 CT1 27 (SUTURE) ×9
SUT VIC AB 2-0 CT1 TAPERPNT 27 (SUTURE) ×2 IMPLANT
SUT VIC AB 2-0 UR6 27 (SUTURE) IMPLANT
SUT VIC AB 3-0 SH 27 (SUTURE)
SUT VIC AB 3-0 SH 27X BRD (SUTURE) IMPLANT
SYR BULB IRRIG 60ML STRL (SYRINGE) ×3 IMPLANT
SYR TOOMEY IRRIG 70ML (MISCELLANEOUS) ×3
SYRINGE TOOMEY IRRIG 70ML (MISCELLANEOUS) ×2 IMPLANT
TOWEL OR 17X26 10 PK STRL BLUE (TOWEL DISPOSABLE) ×6 IMPLANT
TRAY FOLEY W/BAG SLVR 14FR LF (SET/KITS/TRAYS/PACK) ×3 IMPLANT

## 2021-06-19 NOTE — Anesthesia Preprocedure Evaluation (Addendum)
Anesthesia Evaluation  Patient identified by MRN, date of birth, ID band Patient awake    Reviewed: Allergy & Precautions, NPO status , Patient's Chart, lab work & pertinent test results  Airway Mallampati: II  TM Distance: >3 FB Neck ROM: Full    Dental  (+) Teeth Intact   Pulmonary neg pulmonary ROS,    Pulmonary exam normal        Cardiovascular negative cardio ROS   Rhythm:Regular Rate:Normal     Neuro/Psych  Headaches, negative psych ROS   GI/Hepatic negative GI ROS, Neg liver ROS,   Endo/Other  negative endocrine ROS  Renal/GU negative Renal ROS  Female GU complaint Fibroids, prolapse    Musculoskeletal negative musculoskeletal ROS (+)   Abdominal (+)  Abdomen: soft.    Peds  Hematology negative hematology ROS (+)   Anesthesia Other Findings   Reproductive/Obstetrics                             Anesthesia Physical Anesthesia Plan  ASA: 2  Anesthesia Plan: General   Post-op Pain Management:    Induction: Intravenous  PONV Risk Score and Plan: 3 and Ondansetron, Dexamethasone, Midazolam and Treatment may vary due to age or medical condition  Airway Management Planned: Mask and Oral ETT  Additional Equipment: None  Intra-op Plan:   Post-operative Plan: Extubation in OR  Informed Consent: I have reviewed the patients History and Physical, chart, labs and discussed the procedure including the risks, benefits and alternatives for the proposed anesthesia with the patient or authorized representative who has indicated his/her understanding and acceptance.     Dental advisory given  Plan Discussed with: CRNA  Anesthesia Plan Comments:         Anesthesia Quick Evaluation

## 2021-06-19 NOTE — Op Note (Signed)
06/19/2021  12:36 PM  PATIENT:  Kathryn Lucero  55 y.o. female  PRE-OPERATIVE DIAGNOSIS:  complete uterovaginal prolapse  POST-OPERATIVE DIAGNOSIS:  complete uterovaginal prolapse  PROCEDURE:  Procedure(s): HYSTERECTOMY VAGINAL, WITH BILATERAL SALPINGECTOMY (N/A) TRANSVAGINAL TAPE (TVT) PROCEDURE (N/A) ANTERIOR REPAIR (CYSTOCELE) (N/A) CYSTOSCOPY (N/A)  SURGEON:  Surgeon(s) and Role:    * Kathryn Charleston, MD - Primary    * Marinone, Edwinna Areola, MD - Assisting  ANESTHESIA:   general  EBL:  300 mL   DRAINS: Urinary Catheter (Foley)   LOCAL MEDICATIONS USED:  LIDOCAINE  and OTHER Indigo carmine, surgiflo  SPECIMEN:  Source of Specimen:  uterus, cervix, bilateral tubes  DISPOSITION OF SPECIMEN:  PATHOLOGY  COUNTS:  YES  TOURNIQUET:  * No tourniquets in log *  DICTATION: .Note written in EPIC  PLAN OF CARE: Discharge to home after PACU  PATIENT DISPOSITION:  PACU - hemodynamically stable.   Delay start of Pharmacological VTE agent (>24hrs) due to surgical blood loss or risk of bleeding: not applicable  Meds:  Surgiflo, lidocaine with epi, indigo carmine,, and estrace.  Findings: 8week size uterus and normal ovaries.  Prolapse to 3 cm past the introitus.  Technique:  After general anesthesia was achieved, the patient was prepped and draped in a sterile fashion.  The bladder was emptied with a red rubber catheter and 60 cc of Similac was placed in the bladder. The cervix was grasped with a pair of Lahey clamps and injected circumferentially with 0.5% marcaine with epi. A circumferential incision was made around the cervix with the scalpel at the level of the reflection of the vagina onto the cervix and the posterior cul-de-sac was entered into with Mayo scissors. The long billed duckbill retractor was then placed and the bladder was removed off the cervix carefully with sharp dissection with the Metzenbaums. The the uterosacrals were grasped with a pair heney clamps on  either side and secured with a Heaney stitch of 0 Vicryl. Cardinal ligament was then divided with alternating successive bites of the Heaney clamp followed by incision with the Mayo scissors and secured with stitches of 0 Vicryl at the level of the cornua Heaneys were placed bilaterally around the entire pedicle and the uterus was able to be amputated.  The pedicles were secured with a free tie of 0 vicryl.  The ovaries and tubes on each side were successively brought down with a babcock and the IP ligaments identified.  Each IP was then clamped with a heaney close to the ovary.  The pedicles were each secured with a free hand stitch of 0 Vicryl followed by a stitch of 0 Vicryl bilaterally. The tubes were then grasped with a babcock and a kelly placed under in the mesosalpinx.  The tubes were amputated with the bovie and each secured with a free tie of ) vicryl. Hemostasis was achieved with superficial cautery and superficial stitches of 0 vicryl.  The posterior cuff was run with 2-0 vicryl secondary bleeding. Once hemostasis was achieved the peritoneum was closed in a pursestring fashion including the bilateral uterosacrals and posteriorly in and out of the vagina and a partial Halbans culdoplasty.  The stitch was pulled taut closing the peritoneum and pulling the uterosacrals together through the modified Hall bands and through the vagina. The Foley was placed at this point.  The anterior vaginal mucosa was then entered into in the midline with the help of Alice's after being injected with 0.5% marcaine with epi with the scalpel. The vaginal mucosa  was then reflected off of the vesicouterine fascia with careful sharp dissection with the Metzenbaums until the pelvic floor could feel be felt underneath the pubic bone. 2 small stab incisions are made on 2 cm either side of the midline just above the pubic bone. The abdominal needles were placed carefully through the pelvic floor and out the vagina. The Foley was  removed and the cystoscope placed inside the bladder; and both needles sere seen in the bladder.  The needles were withdrawn and replaced.  This time there were NO NEEDLES in the bladder and there was good spillage of indigo carmine from the bilateral ureteral orifices and the trigone and urethra were clear.  The foley was replaced. The abdominal needles were attached the vaginal needles and the tape pulled through and cut into place. A Claiborne Billings was used to ensure that the there was no tension placed on the tape. Once I was satisfied that the sling was in the correct place and at no tension, the anterior repair was done with 4  mattress stitches of 0 Vicryl. Hemostasis was achieved with surgiflo in the corners by the tape. Surgiflo had also been placed in the abdominal incisions. The vaginal mucosa was trimmed and closed with a running locked stitch of 2-0 Vicryl. The rest of the surgiflo was placed in the cuff and the cuff was then closed with interrupted figure-of-eight stitches of 0 Vicryl. A vaginal pack was placed inside the vagina with Estrace cream and the patient tolerated the procedure was returned to recovery in stable condition.  The pt will removed the vaginal pack tomorrow morning and will need to have the urinary catheter for a week.  A voiding trial will be scheduled for next Thurs.   Kathryn Lucero A

## 2021-06-19 NOTE — Transfer of Care (Signed)
Immediate Anesthesia Transfer of Care Note  Patient: Kathryn Lucero  Procedure(s) Performed: Procedure(s) (LRB): HYSTERECTOMY VAGINAL, WITH BILATERAL SALPINGECTOMY (N/A) TRANSVAGINAL TAPE (TVT) PROCEDURE (N/A) ANTERIOR REPAIR (CYSTOCELE) (N/A) CYSTOSCOPY (N/A)  Patient Location: PACU  Anesthesia Type: General  Level of Consciousness: awake, sedated, patient cooperative and responds to stimulation  Airway & Oxygen Therapy: Patient Spontanous Breathing and Patient connected to 02 Bells and soft FM   Post-op Assessment: Report given to PACU RN, Post -op Vital signs reviewed and stable and Patient moving all extremities  Post vital signs: Reviewed and stable  Complications: No apparent anesthesia complications

## 2021-06-19 NOTE — Discharge Instructions (Addendum)
Keep Foley in place- give patient leg bag and show how to change bags.  Instruct on care.  voiding trial at office- call and come in at 830 am on next Thursday.  Pt will have to wear the catheter for a week secondary bladder puncture.          Post Anesthesia Home Care Instructions  Activity: Get plenty of rest for the remainder of the day. A responsible individual must stay with you for 24 hours following the procedure.  For the next 24 hours, DO NOT: -Drive a car -Paediatric nurse -Drink alcoholic beverages -Take any medication unless instructed by your physician -Make any legal decisions or sign important papers.  Meals: Start with liquid foods such as gelatin or soup. Progress to regular foods as tolerated. Avoid greasy, spicy, heavy foods. If nausea and/or vomiting occur, drink only clear liquids until the nausea and/or vomiting subsides. Call your physician if vomiting continues.  Special Instructions/Symptoms: Your throat may feel dry or sore from the anesthesia or the breathing tube placed in your throat during surgery. If this causes discomfort, gargle with warm salt water. The discomfort should disappear within 24 hours.

## 2021-06-19 NOTE — Progress Notes (Signed)
There has been no change in the patients history, status or exam since the history and physical.  Vitals:   06/13/21 1434 06/19/21 0840  BP:  (!) 117/53  Pulse:  64  Resp:  14  Temp:  97.8 F (36.6 C)  TempSrc:  Oral  SpO2:  100%  Weight: 57.6 kg 57.8 kg  Height: 5\' 2"  (1.575 m) 5\' 2"  (1.575 m)    Results for orders placed or performed during the hospital encounter of 06/19/21 (from the past 72 hour(s))  Pregnancy, urine POC     Status: None   Collection Time: 06/19/21  8:23 AM  Result Value Ref Range   Preg Test, Ur NEGATIVE NEGATIVE    Comment:        THE SENSITIVITY OF THIS METHODOLOGY IS >24 mIU/mL     Daria Pastures

## 2021-06-19 NOTE — Anesthesia Procedure Notes (Signed)
Procedure Name: Intubation Date/Time: 06/19/2021 10:19 AM Performed by: Justice Rocher, CRNA Pre-anesthesia Checklist: Patient identified, Emergency Drugs available, Suction available, Patient being monitored and Timeout performed Patient Re-evaluated:Patient Re-evaluated prior to induction Oxygen Delivery Method: Circle system utilized Preoxygenation: Pre-oxygenation with 100% oxygen Induction Type: IV induction Ventilation: Mask ventilation without difficulty Laryngoscope Size: Mac and 3 Grade View: Grade II Tube type: Oral Tube size: 7.0 mm Number of attempts: 1 Airway Equipment and Method: Stylet and Oral airway Placement Confirmation: ETT inserted through vocal cords under direct vision, positive ETCO2, breath sounds checked- equal and bilateral and CO2 detector Secured at: 21 cm Tube secured with: Tape Dental Injury: Teeth and Oropharynx as per pre-operative assessment

## 2021-06-19 NOTE — Brief Op Note (Signed)
06/19/2021  12:36 PM  PATIENT:  Kathryn Lucero  55 y.o. female  PRE-OPERATIVE DIAGNOSIS:  complete uterovaginal prolapse  POST-OPERATIVE DIAGNOSIS:  complete uterovaginal prolapse  PROCEDURE:  Procedure(s): HYSTERECTOMY VAGINAL, WITH BILATERAL SALPINGECTOMY (N/A) TRANSVAGINAL TAPE (TVT) PROCEDURE (N/A) ANTERIOR REPAIR (CYSTOCELE) (N/A) CYSTOSCOPY (N/A)  SURGEON:  Surgeon(s) and Role:    * Bobbye Charleston, MD - Primary    * Marinone, Edwinna Areola, MD - Assisting  ANESTHESIA:   general  EBL:  300 mL   DRAINS: Urinary Catheter (Foley)   LOCAL MEDICATIONS USED:  LIDOCAINE  and OTHER Indigo carmine, surgiflo  SPECIMEN:  Source of Specimen:  uterus, cervix, bilateral tubes  DISPOSITION OF SPECIMEN:  PATHOLOGY  COUNTS:  YES  TOURNIQUET:  * No tourniquets in log *  DICTATION: .Note written in EPIC  PLAN OF CARE: Discharge to home after PACU  PATIENT DISPOSITION:  PACU - hemodynamically stable.   Delay start of Pharmacological VTE agent (>24hrs) due to surgical blood loss or risk of bleeding: not applicable

## 2021-06-19 NOTE — Anesthesia Postprocedure Evaluation (Signed)
Anesthesia Post Note  Patient: Chiropractor  Procedure(s) Performed: HYSTERECTOMY VAGINAL, WITH BILATERAL SALPINGECTOMY (Vagina ) TRANSVAGINAL TAPE (TVT) PROCEDURE (Vagina ) ANTERIOR REPAIR (CYSTOCELE) (Vagina ) CYSTOSCOPY (Bladder)     Patient location during evaluation: PACU Anesthesia Type: General Level of consciousness: awake and alert Pain management: pain level controlled Vital Signs Assessment: post-procedure vital signs reviewed and stable Respiratory status: spontaneous breathing, nonlabored ventilation, respiratory function stable and patient connected to nasal cannula oxygen Cardiovascular status: blood pressure returned to baseline and stable Postop Assessment: no apparent nausea or vomiting Anesthetic complications: no   No notable events documented.  Last Vitals:  Vitals:   06/19/21 1345 06/19/21 1400  BP: 102/62 104/66  Pulse: 70 71  Resp: 15 17  Temp:  36.5 C  SpO2: 97% 98%    Last Pain:  Vitals:   06/19/21 1400  TempSrc:   PainSc: 6                  Jozelyn Kuwahara P Chivon Lepage

## 2021-06-19 NOTE — Discharge Summary (Signed)
Physician Discharge Summary  Patient ID: Kathryn Lucero MRN: [id number] DOB/AGE: 53-Nov-1967 53 y.o.  Admit date: 06/19/2021 Discharge date: 06/19/2021  Admission Diagnoses:  Discharge Diagnoses:  Active Problems:   * No active hospital problems. *   Discharged Condition: good  Hospital Course: TVH/TVT/A repair/salpingectomies/cysto complicated by bilateral needle placement in bladder.  Needles were replaced and no needles were in bladder on second attempt.  Puncture sites were hemostatic.  Pt will need urinary catheter for a week and voiding trial at office next Thursday.   Consults: None  Significant Diagnostic Studies: none  Treatments: surgery: TVH/TVT/A repair/salpingectomies/cysto complicated by bilateral needle placement in bladder.  Needles were replaced and no needles were in bladder on second attempt.  Puncture sites were hemostatic.   Discharge Exam: Blood pressure (!) 117/53, pulse 64, temperature 97.8 F (36.6 C), temperature source Oral, resp. rate 14, height 5\' 2"  (1.575 m), weight 57.8 kg, SpO2 100 %.   Disposition: Discharge disposition: 01-Home or Self Care       Discharge Instructions     Call MD for:  temperature >100.4   Complete by: As directed    Diet - low sodium heart healthy   Complete by: As directed    Discharge instructions   Complete by: As directed    No driving on narcotics, no sexual activity for 2 weeks.   Increase activity slowly   Complete by: As directed    May shower / Bathe   Complete by: As directed    Shower, no bath for 2 weeks.   Remove dressing in 24 hours   Complete by: As directed    Sexual Activity Restrictions   Complete by: As directed    No sexual activity for 2 weeks.      Allergies as of 06/19/2021   No Active Allergies      Medication List     STOP taking these medications    UNKNOWN TO PATIENT       TAKE these medications    oxyCODONE-acetaminophen 5-325 MG tablet Commonly known as:  PERCOCET/ROXICET Take 1 tablet by mouth every 4 (four) hours as needed for severe pain.        Follow-up Information     Bobbye Charleston, MD Follow up.   Specialty: Obstetrics and Gynecology Why: Next Thursday for voiding trial Contact information: [address] [place] [address] [phone]                 Signed: Daria Pastures 06/19/2021, 12:50 PM

## 2021-06-19 NOTE — H&P (Signed)
55 y.o. G3P3003 complains of complete uterine prolapse.  Over last few years pt's uterus has been slowly decending until this past year where the cervix now usually stays outside the body and the cervix is being keritanized. 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  However, urethra and bladder are completely out of place and she will need a TVT to repair the urethral hypermobility and cystocele.  Pt is anxious to have all repaired.   Past Medical History:  Diagnosis Date   Fibroids    Past Surgical History:  Procedure Laterality Date   APPENDECTOMY  1993   IR ANGIOGRAM PELVIS SELECTIVE OR SUPRASELECTIVE  05/06/2017   IR ANGIOGRAM PELVIS SELECTIVE OR SUPRASELECTIVE  05/06/2017   IR ANGIOGRAM SELECTIVE EACH ADDITIONAL VESSEL  05/06/2017   IR ANGIOGRAM SELECTIVE EACH ADDITIONAL VESSEL  05/06/2017   IR EMBO TUMOR ORGAN ISCHEMIA INFARCT INC GUIDE ROADMAPPING  05/06/2017   IR RADIOLOGIST EVAL & MGMT  03/12/2017   IR RADIOLOGIST EVAL & MGMT  06/09/2017   IR RADIOLOGIST EVAL & MGMT  12/02/2017   IR US GUIDE VASC ACCESS LEFT  05/06/2017   IR US GUIDE VASC ACCESS RIGHT  05/06/2017    Social History   Socioeconomic History   Marital status: Married    Spouse name: Not on file   Number of children: Not on file   Years of education: Not on file   Highest education level: Not on file  Occupational History   Not on file  Tobacco Use   Smoking status: Never   Smokeless tobacco: Never  Vaping Use   Vaping Use: Never used  Substance and Sexual Activity   Alcohol use: No   Drug use: No   Sexual activity: Not on file  Other Topics Concern   Not on file  Social History Narrative   Not on file   Social Determinants of Health   Financial Resource Strain: Not on file  Food Insecurity: Not on file  Transportation Needs: Not on file  Physical Activity: Not on file  Stress: Not on file  Social Connections: Not on file  Intimate Partner Violence: Not on file    No current  facility-administered medications on file prior to encounter.   Current Outpatient Medications on File Prior to Encounter  Medication Sig Dispense Refill   UNKNOWN TO PATIENT Birth control patch leaves on all the time      No Known Allergies  Vitals:   06/13/21 1434  Weight: 57.6 kg  Height: 5\' 2"  (1.575 m)    Lungs: clear to ascultation Cor:  RRR Abdomen:  soft, nontender, nondistended. Ex:  no cords, erythema Pelvic:   Female Genitalia: Vulva: no masses, atrophy, or lesions. Vagina: no tenderness, erythema, rectocele, abnormal vaginal discharge, or vesicle(s) or ulcers and cystocele (to nearly 0 with valsalva). Cervix: no discharge or cervical motion tenderness and grossly normal. Uterus: enlarged (9 weeks fills posterior, slightly more mobile than before) and uterine prolapse (now complete prolapse with cervix +2 outside hymen. Cervix is kertinized now.) and midline, non-tender, and normal shape. 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 Adnexa/Parametria: no parametrial tenderness or mass and no adnexal tenderness or ovarian mass.  A:  For TVH/TVT/A repair, cysto and possbile salpingectomies.   P: P: All risks, benefits and alternatives d/w patient and she desires to proceed.  Patient has ERAS protocol and will receive preop antibiotics and SCDs during the operation.   Pt will  go home same day if eating, ambulating, and pain control is good.  Daria Pastures

## 2021-06-20 ENCOUNTER — Encounter (HOSPITAL_BASED_OUTPATIENT_CLINIC_OR_DEPARTMENT_OTHER): Payer: Self-pay | Admitting: Obstetrics and Gynecology

## 2021-06-20 LAB — SURGICAL PATHOLOGY

## 2021-07-04 ENCOUNTER — Ambulatory Visit: Payer: Self-pay | Admitting: Physician Assistant
